# Patient Record
Sex: Female | Born: 1950 | Race: White | Hispanic: No | Smoking: Never smoker
Health system: Southern US, Community
[De-identification: ages and names within clinical notes are randomized; demographics above are authoritative.]

## PROBLEM LIST (undated history)

## (undated) DIAGNOSIS — D219 Benign neoplasm of connective and other soft tissue, unspecified: Secondary | ICD-10-CM

## (undated) DIAGNOSIS — E785 Hyperlipidemia, unspecified: Secondary | ICD-10-CM

## (undated) DIAGNOSIS — I10 Essential (primary) hypertension: Secondary | ICD-10-CM

## (undated) DIAGNOSIS — M1990 Unspecified osteoarthritis, unspecified site: Secondary | ICD-10-CM

## (undated) DIAGNOSIS — D649 Anemia, unspecified: Secondary | ICD-10-CM

## (undated) HISTORY — DX: Hyperlipidemia, unspecified: E78.5

## (undated) HISTORY — DX: Benign neoplasm of connective and other soft tissue, unspecified: D21.9

## (undated) HISTORY — DX: Essential (primary) hypertension: I10

## (undated) HISTORY — DX: Unspecified osteoarthritis, unspecified site: M19.90

## (undated) HISTORY — DX: Anemia, unspecified: D64.9

---

## 1998-08-19 ENCOUNTER — Ambulatory Visit (HOSPITAL_COMMUNITY): Admission: RE | Admit: 1998-08-19 | Discharge: 1998-08-19 | Payer: Self-pay | Admitting: Gastroenterology

## 1999-03-29 HISTORY — PX: VAGINAL HYSTERECTOMY: SUR661

## 1999-10-04 ENCOUNTER — Encounter (INDEPENDENT_AMBULATORY_CARE_PROVIDER_SITE_OTHER): Payer: Self-pay | Admitting: Specialist

## 1999-10-04 ENCOUNTER — Inpatient Hospital Stay (HOSPITAL_COMMUNITY): Admission: RE | Admit: 1999-10-04 | Discharge: 1999-10-05 | Payer: Self-pay | Admitting: Obstetrics and Gynecology

## 2001-12-28 ENCOUNTER — Ambulatory Visit (HOSPITAL_COMMUNITY): Admission: RE | Admit: 2001-12-28 | Discharge: 2001-12-28 | Payer: Self-pay | Admitting: Gastroenterology

## 2004-07-27 ENCOUNTER — Encounter: Admission: RE | Admit: 2004-07-27 | Discharge: 2004-07-27 | Payer: Self-pay | Admitting: Obstetrics and Gynecology

## 2004-08-18 ENCOUNTER — Encounter: Admission: RE | Admit: 2004-08-18 | Discharge: 2004-08-18 | Payer: Self-pay | Admitting: Obstetrics and Gynecology

## 2005-02-18 ENCOUNTER — Encounter: Admission: RE | Admit: 2005-02-18 | Discharge: 2005-02-18 | Payer: Self-pay | Admitting: Obstetrics and Gynecology

## 2005-08-09 ENCOUNTER — Encounter: Admission: RE | Admit: 2005-08-09 | Discharge: 2005-08-09 | Payer: Self-pay | Admitting: Obstetrics and Gynecology

## 2006-06-06 ENCOUNTER — Encounter: Admission: RE | Admit: 2006-06-06 | Discharge: 2006-06-06 | Payer: Self-pay | Admitting: Rheumatology

## 2006-08-11 ENCOUNTER — Encounter: Admission: RE | Admit: 2006-08-11 | Discharge: 2006-08-11 | Payer: Self-pay | Admitting: Obstetrics and Gynecology

## 2007-08-13 ENCOUNTER — Encounter: Admission: RE | Admit: 2007-08-13 | Discharge: 2007-08-13 | Payer: Self-pay | Admitting: Obstetrics and Gynecology

## 2008-08-13 ENCOUNTER — Encounter: Admission: RE | Admit: 2008-08-13 | Discharge: 2008-08-13 | Payer: Self-pay | Admitting: Obstetrics and Gynecology

## 2009-09-01 ENCOUNTER — Encounter: Admission: RE | Admit: 2009-09-01 | Discharge: 2009-09-01 | Payer: Self-pay | Admitting: Obstetrics and Gynecology

## 2010-07-23 ENCOUNTER — Other Ambulatory Visit: Payer: Self-pay | Admitting: Unknown Physician Specialty

## 2010-07-23 DIAGNOSIS — Z1231 Encounter for screening mammogram for malignant neoplasm of breast: Secondary | ICD-10-CM

## 2010-08-13 NOTE — Discharge Summary (Signed)
[facility]  Patient:    AJANEE, BUREN                       MRN: [id number] Adm. Date:  [id number] Disc. Date: [id number] Attending:  Lendon Colonel                           Discharge Summary  ADMITTING DIAGNOSIS:  Menorrhagia, persistent, with anemia.  DISCHARGE DIAGNOSIS:  Menorrhagia, persistent, with anemia.  OPERATION:  Vaginal hysterectomy.  BRIEF HISTORY:  Ms. Moe is a 60 year old female who has had numerous attempts at controlling heavy periods including hysteroscopy, numerous dilation and curettages.  She is intolerant to oral contraceptives.  Because of the continued bleeding and the anemia, patient was admitted for a vaginal hysterectomy.  A coagulation profile, comprehensive metabolic profile was normal.  Hemoglobin was 9.3, MCV was 78.  She was on oral iron.  Cardiogram was normal.  HOSPITAL COURSE:  Patient underwent an uneventful vaginal hysterectomy without complications.  Her postoperative course was uncomplicated.  The morning following surgery she was afebrile, voiding and eating, and ambulating.  She is discharged with Percocet for pain.  She is to call for fever, bleeding, or abdominal pain, or any other problems she may encounter. She will return to the office in two weeks.  CONDITION ON DISCHARGE:  Improved. DD:  10/05/99 TD:  10/05/99 Job: [id number]

## 2010-08-13 NOTE — Op Note (Signed)
   NAME:  Brooke Hurst, Brooke Hurst                          ACCOUNT NO.:  [id number]   MEDICAL RECORD NO.:  [id number]                   PATIENT TYPE:  AMB   LOCATION:  ENDO                                 FACILITY:  [facility]   PHYSICIAN:  John C. Madilyn Fireman, M.D.                 DATE OF BIRTH:  1950/11/12   DATE OF PROCEDURE:  12/28/2001  DATE OF DISCHARGE:                                 OPERATIVE REPORT   PROCEDURE:  Colonoscopy.   INDICATION FOR PROCEDURE:  Heme-positive stools and anemia and family  history of colon cancer in a first-degree relative.   DESCRIPTION OF PROCEDURE:  The patient was placed in the left lateral  decubitus position and placed on the pulse monitor with continuous low-flow  oxygen delivered by nasal cannula.  She was sedated with 70 mg IV Demerol  and 8 mg IV Versed.  The Olympus video colonoscope was inserted into the  rectum and advanced to the cecum, confirmed by transillumination at  McBurney's point and visualization of the ileocecal valve and appendiceal  orifice.  The prep was excellent.  The cecum, ascending, transverse,  descending, and sigmoid colon all appeared normal with no masses, polyps,  diverticula, or other mucosal abnormalities.  The rectum likewise appeared  normal, and retroflexed view of the anus revealed no obvious internal  hemorrhoids.  The colonoscope was then withdrawn, and the patient returned  to the recovery room in stable condition.  She tolerated the procedure well,  and there were no immediate complications.   IMPRESSION:  Normal colonoscopy.   PLAN:  Will need to repeat Hemoccults and if positive, consider upper GI  with small bowel series or possibly EGD.                                               John C. Madilyn Fireman, M.D.    JCH/MEDQ  D:  12/28/2001  T:  12/28/2001  Job:  [id number]   cc:   Raynelle Dick, M.D.  [address]  [place]  [place] [address]  Fax: [phone]

## 2010-08-13 NOTE — Op Note (Signed)
[facility]  Patient:    Brooke Hurst, Brooke Hurst                       MRN: [id number] Proc. Date: 10/04/99 Adm. Date:  [id number] Attending:  Lendon Colonel                           Operative Report  OPERATION PERFORMED:  Vaginal hysterectomy.  PREOPERATIVE DIAGNOSIS:  Menorrhagia with anemia.  POSTOPERATIVE DIAGNOSIS:  Menorrhagia with anemia.  DESCRIPTION OF PROCEDURE:  The patient was placed in lithotomy position, prepped and draped in the usual fashion, cervix injected with 10 cc of 1% xylocaine with epinephrine. The cul-de-sac was incised with sharp dissection. The uterosacral and cardinals were clamped and ligated with #0 chromic suture. The anterior cul-de-sac was incised and the uterine arteries and upper broad ligament were clamped in succession and ligated with #0 chromic suture. These specimens were retroverted. Utero-ovarian anastomosis clamped and suture ligated with #0 chromic suture. Both ovaries were normal. The uterosacral ligaments were then plicated in the midline with #0 Vicryl and the vagina was closed from the uterosacral ligaments downward with figure-of-eight sutures of #0 Vicryl. The pelvic peritoneum was then pursestringed with 2-0 PDS. Sponge and needle count was correct. The vagina was then closed with a locking suture of 2-0 PDS which is a continuation of the peritoneal suture. The patient tolerated the procedure well. Estimated blood loss was about 100 cc. She was sent to the recovery room in good condition. DD:  10/04/99 TD:  10/04/99 Job: [id number]

## 2010-08-13 NOTE — H&P (Signed)
[facility]  Patient:    Brooke Hurst, Brooke Hurst                         MRN: [id number] Adm. Date:  10/04/99 Attending:  Katherine Roan, M.D.                         History and Physical  CHIEF COMPLAINT:  Heavy prolonged periods.  HISTORY OF PRESENT ILLNESS:  Ms. Lafond is a 60 year old gravida 3, para 3 female, who complains of heavy menses.  The second day is the worst.  She has had at least three D&Cs and a hysteroscopy with endometrial resection in 1994.  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  She cannot take oral contraceptives because f headaches.  She has tried nonsteroidal anti-inflammatory drugs and that does not help her heavy flow.  She has known anemia and is on iron.  Her last menstrual period was September 08, 1999.  Pap smear was normal.  She has had a history of three normal babies and a tubal ligation in 1978.  ALLERGIES:  CODEINE.  REVIEW OF SYSTEMS:  HEENT:  She wears glasses, but no dizziness, no decrease in  visual or auditory acuity.  No headaches.  HEART:  No history of hypertension. No rheumatic fever.  No chest pain.  LUNGS:  No history of chronic cough.  No asthma. No hemoptysis.  No history of pneumonia.  GENITOURINARY:  She denies stress incontinence.  No history of recurrent urinary tract infections.  GI: Negative. No history of melena.  No weight loss.  No bowel habit change. MUSCLES/BONES/JOINTS:  No fractures.  No decrease in range of motion of any joint.  SOCIAL HISTORY:  She works at [employer].  FAMILY HISTORY:  Her mother is age 14 and has hypertension, and has cholesterol  problems.  Father died at age 41 with colon cancer.  She has a maternal grandmother with uterine cancer, and maternal grandmother with hypertension.  Her paternal grandfather has diabetes mellitus.  She has two brothers who are living and well.  PHYSICAL EXAMINATION:  VITAL SIGNS:  Weight 152 pounds, blood pressure  118/70.  HEENT:  Unremarkable.  NECK:  Supple.  Thyroid is not enlarged.  Carotid pulses equal, without bruits. No adenopathy is appreciated.  Trachea is in the midline.  LUNGS:  Clear.  BREASTS:  No masses or tenderness.  HEART:  Normal sinus rhythm, no murmurs.  ABDOMEN:  Soft, flat.  Liver, kidneys, spleen are nonpalpable.  Bowel sounds are normal.  No bruits are heard.  EXTREMITIES:  Good range of motion and equal pulses and reflexes.  PELVIC:  A clean cervix.  Uterus is anterior, slightly enlarged, with no masses. Bartholin, Skenes, and urethra are normal.  IMPRESSION:  Profuse periods, unresponsive to management, consisting of dilations and curettages and hysteroscopy.  PLAN:  A vaginal hysterectomy.  The risks and benefits are discussed with the patient. DD:  10/01/99 TD:  10/01/99 Job: [id number]

## 2010-09-03 ENCOUNTER — Ambulatory Visit
Admission: RE | Admit: 2010-09-03 | Discharge: 2010-09-03 | Disposition: A | Discharge status: Home / Self Care | Payer: BC Managed Care – PPO | Source: Ambulatory Visit | Attending: Unknown Physician Specialty | Admitting: Unknown Physician Specialty

## 2010-09-03 DIAGNOSIS — Z1231 Encounter for screening mammogram for malignant neoplasm of breast: Secondary | ICD-10-CM

## 2011-08-02 ENCOUNTER — Other Ambulatory Visit: Payer: Self-pay | Admitting: Unknown Physician Specialty

## 2011-08-02 DIAGNOSIS — Z1231 Encounter for screening mammogram for malignant neoplasm of breast: Secondary | ICD-10-CM

## 2011-08-31 ENCOUNTER — Encounter: Payer: Self-pay | Admitting: Obstetrics & Gynecology

## 2011-08-31 ENCOUNTER — Ambulatory Visit (INDEPENDENT_AMBULATORY_CARE_PROVIDER_SITE_OTHER): Payer: BC Managed Care – PPO | Admitting: Obstetrics & Gynecology

## 2011-08-31 VITALS — BP 123/81 | HR 76 | Temp 98.6°F | Resp 16 | Ht 62.0 in | Wt 155.0 lb

## 2011-08-31 DIAGNOSIS — M8580 Other specified disorders of bone density and structure, unspecified site: Secondary | ICD-10-CM | POA: Insufficient documentation

## 2011-08-31 DIAGNOSIS — Z01419 Encounter for gynecological examination (general) (routine) without abnormal findings: Secondary | ICD-10-CM

## 2011-08-31 DIAGNOSIS — M810 Age-related osteoporosis without current pathological fracture: Secondary | ICD-10-CM | POA: Insufficient documentation

## 2011-08-31 DIAGNOSIS — M899 Disorder of bone, unspecified: Secondary | ICD-10-CM

## 2011-08-31 DIAGNOSIS — M949 Disorder of cartilage, unspecified: Secondary | ICD-10-CM

## 2011-08-31 NOTE — Progress Notes (Signed)
  Subjective:    Brooke Hurst is a 61 y.o. female who presents for an annual exam. The patient has no complaints today. The patient is not currently sexually active. GYN screening history: last pap: was normal. The patient wears seatbelts: yes. The patient participates in regular exercise: walk. Has the patient ever been transfused or tattooed?: no. The patient reports that there is not domestic violence in her life.   Menstrual History: OB History    Grav Para Term Preterm Abortions TAB SAB Ect Mult Living   3 3 3       3       Menarche age: n/a No LMP recorded. Patient has had a hysterectomy.    The following portions of the patient's history were reviewed and updated as appropriate: allergies, current medications, past family history, past medical history, past social history, past surgical history and problem list.  Review of Systems A comprehensive review of systems was negative except for: Genitourinary: positive for vaginal dryness    Objective:    Vitals:  WNL General appearance: alert, cooperative and no distress Head: Normocephalic, without obvious abnormality, atraumatic Eyes: negative Throat: lips, mucosa, and tongue normal; teeth and gums normal Lungs: clear to auscultation bilaterally Breasts: normal appearance, no masses or tenderness, No nipple retraction or dimpling, No nipple discharge or bleeding Heart: regular rate and rhythm Abdomen: soft, non-tender; bowel sounds normal; no masses,  no organomegaly Pelvic: cervix normal in appearance, external genitalia normal, no adnexal masses or tenderness, no bladder tenderness, no cervical motion tenderness, perianal skin: no external genital warts noted, rectovaginal septum normal, urethra without abnormality or discharge, uterus surgically absent and vagina atrophic Extremities: no edema, redness or tenderness in the calves or thighs Skin: no lesions or rash Lymph nodes: Axillary adenopathy: none   .    Assessment:     Healthy female exam.    Plan:     All questions answered.  Pt followed by primary care for osterpenia, coloscopy, and mammograms. Pt no longer needs pap smears--no uterus and no history of abnml paps

## 2011-09-06 ENCOUNTER — Ambulatory Visit: Payer: BC Managed Care – PPO

## 2011-09-13 ENCOUNTER — Ambulatory Visit
Admission: RE | Admit: 2011-09-13 | Discharge: 2011-09-13 | Disposition: A | Discharge status: Home / Self Care | Payer: BC Managed Care – PPO | Source: Ambulatory Visit | Attending: Unknown Physician Specialty | Admitting: Unknown Physician Specialty

## 2011-09-13 DIAGNOSIS — Z1231 Encounter for screening mammogram for malignant neoplasm of breast: Secondary | ICD-10-CM

## 2011-10-14 ENCOUNTER — Other Ambulatory Visit: Payer: Self-pay | Admitting: Family Medicine

## 2011-10-14 ENCOUNTER — Ambulatory Visit: Admission: RE | Admit: 2011-10-14 | Payer: BC Managed Care – PPO | Source: Ambulatory Visit

## 2011-10-14 ENCOUNTER — Ambulatory Visit
Admission: RE | Admit: 2011-10-14 | Discharge: 2011-10-14 | Disposition: A | Discharge status: Home / Self Care | Payer: BC Managed Care – PPO | Source: Ambulatory Visit | Attending: Family Medicine | Admitting: Family Medicine

## 2011-10-14 ENCOUNTER — Ambulatory Visit: Payer: BC Managed Care – PPO

## 2011-10-14 ENCOUNTER — Ambulatory Visit (HOSPITAL_BASED_OUTPATIENT_CLINIC_OR_DEPARTMENT_OTHER): Payer: BC Managed Care – PPO

## 2011-10-14 DIAGNOSIS — I82409 Acute embolism and thrombosis of unspecified deep veins of unspecified lower extremity: Secondary | ICD-10-CM

## 2012-10-02 ENCOUNTER — Other Ambulatory Visit: Payer: Self-pay | Admitting: Unknown Physician Specialty

## 2012-10-02 DIAGNOSIS — Z1231 Encounter for screening mammogram for malignant neoplasm of breast: Secondary | ICD-10-CM

## 2012-10-04 ENCOUNTER — Ambulatory Visit (INDEPENDENT_AMBULATORY_CARE_PROVIDER_SITE_OTHER): Payer: BC Managed Care – PPO

## 2012-10-04 DIAGNOSIS — Z1231 Encounter for screening mammogram for malignant neoplasm of breast: Secondary | ICD-10-CM

## 2013-11-11 ENCOUNTER — Other Ambulatory Visit: Payer: Self-pay | Admitting: Unknown Physician Specialty

## 2013-11-11 DIAGNOSIS — Z1231 Encounter for screening mammogram for malignant neoplasm of breast: Secondary | ICD-10-CM

## 2013-11-19 ENCOUNTER — Ambulatory Visit (INDEPENDENT_AMBULATORY_CARE_PROVIDER_SITE_OTHER): Payer: BC Managed Care – PPO

## 2013-11-19 DIAGNOSIS — Z1231 Encounter for screening mammogram for malignant neoplasm of breast: Secondary | ICD-10-CM

## 2014-01-27 ENCOUNTER — Encounter: Payer: Self-pay | Admitting: Obstetrics & Gynecology

## 2014-12-12 ENCOUNTER — Other Ambulatory Visit: Payer: Self-pay | Admitting: Unknown Physician Specialty

## 2014-12-12 DIAGNOSIS — Z1231 Encounter for screening mammogram for malignant neoplasm of breast: Secondary | ICD-10-CM

## 2014-12-31 ENCOUNTER — Ambulatory Visit (INDEPENDENT_AMBULATORY_CARE_PROVIDER_SITE_OTHER): Payer: BLUE CROSS/BLUE SHIELD

## 2014-12-31 DIAGNOSIS — Z1231 Encounter for screening mammogram for malignant neoplasm of breast: Secondary | ICD-10-CM

## 2015-03-10 DIAGNOSIS — I10 Essential (primary) hypertension: Secondary | ICD-10-CM | POA: Insufficient documentation

## 2015-03-10 DIAGNOSIS — G8929 Other chronic pain: Secondary | ICD-10-CM | POA: Insufficient documentation

## 2015-05-04 DIAGNOSIS — M4806 Spinal stenosis, lumbar region: Secondary | ICD-10-CM | POA: Diagnosis not present

## 2015-05-04 DIAGNOSIS — M5416 Radiculopathy, lumbar region: Secondary | ICD-10-CM | POA: Diagnosis not present

## 2015-05-04 DIAGNOSIS — M545 Low back pain: Secondary | ICD-10-CM | POA: Diagnosis not present

## 2015-05-14 DIAGNOSIS — H25013 Cortical age-related cataract, bilateral: Secondary | ICD-10-CM | POA: Diagnosis not present

## 2015-05-18 DIAGNOSIS — M47816 Spondylosis without myelopathy or radiculopathy, lumbar region: Secondary | ICD-10-CM | POA: Diagnosis not present

## 2015-05-18 DIAGNOSIS — M545 Low back pain: Secondary | ICD-10-CM | POA: Diagnosis not present

## 2015-05-18 DIAGNOSIS — M5135 Other intervertebral disc degeneration, thoracolumbar region: Secondary | ICD-10-CM | POA: Diagnosis not present

## 2015-05-25 DIAGNOSIS — M5416 Radiculopathy, lumbar region: Secondary | ICD-10-CM | POA: Diagnosis not present

## 2015-05-25 DIAGNOSIS — M4806 Spinal stenosis, lumbar region: Secondary | ICD-10-CM | POA: Diagnosis not present

## 2015-05-25 DIAGNOSIS — M545 Low back pain: Secondary | ICD-10-CM | POA: Diagnosis not present

## 2015-07-31 DIAGNOSIS — M4807 Spinal stenosis, lumbosacral region: Secondary | ICD-10-CM | POA: Diagnosis not present

## 2015-07-31 DIAGNOSIS — E7800 Pure hypercholesterolemia, unspecified: Secondary | ICD-10-CM | POA: Diagnosis not present

## 2015-07-31 DIAGNOSIS — M8580 Other specified disorders of bone density and structure, unspecified site: Secondary | ICD-10-CM | POA: Diagnosis not present

## 2015-07-31 DIAGNOSIS — I10 Essential (primary) hypertension: Secondary | ICD-10-CM | POA: Diagnosis not present

## 2015-09-11 DIAGNOSIS — M545 Low back pain: Secondary | ICD-10-CM | POA: Diagnosis not present

## 2015-09-11 DIAGNOSIS — M4806 Spinal stenosis, lumbar region: Secondary | ICD-10-CM | POA: Diagnosis not present

## 2015-09-11 DIAGNOSIS — M4126 Other idiopathic scoliosis, lumbar region: Secondary | ICD-10-CM | POA: Diagnosis not present

## 2015-09-11 DIAGNOSIS — M4310 Spondylolisthesis, site unspecified: Secondary | ICD-10-CM | POA: Diagnosis not present

## 2016-01-07 ENCOUNTER — Other Ambulatory Visit: Payer: Self-pay | Admitting: Unknown Physician Specialty

## 2016-01-07 DIAGNOSIS — Z1231 Encounter for screening mammogram for malignant neoplasm of breast: Secondary | ICD-10-CM

## 2016-01-08 ENCOUNTER — Ambulatory Visit (INDEPENDENT_AMBULATORY_CARE_PROVIDER_SITE_OTHER): Payer: Medicare Other

## 2016-01-08 DIAGNOSIS — Z1231 Encounter for screening mammogram for malignant neoplasm of breast: Secondary | ICD-10-CM

## 2016-01-29 DIAGNOSIS — Z23 Encounter for immunization: Secondary | ICD-10-CM | POA: Diagnosis not present

## 2016-01-29 DIAGNOSIS — I10 Essential (primary) hypertension: Secondary | ICD-10-CM | POA: Diagnosis not present

## 2016-01-29 DIAGNOSIS — E7800 Pure hypercholesterolemia, unspecified: Secondary | ICD-10-CM | POA: Diagnosis not present

## 2016-01-29 DIAGNOSIS — R635 Abnormal weight gain: Secondary | ICD-10-CM | POA: Diagnosis not present

## 2016-01-29 DIAGNOSIS — M8580 Other specified disorders of bone density and structure, unspecified site: Secondary | ICD-10-CM | POA: Diagnosis not present

## 2016-01-29 DIAGNOSIS — M899 Disorder of bone, unspecified: Secondary | ICD-10-CM | POA: Diagnosis not present

## 2016-01-29 DIAGNOSIS — R319 Hematuria, unspecified: Secondary | ICD-10-CM | POA: Diagnosis not present

## 2016-01-29 DIAGNOSIS — Z0000 Encounter for general adult medical examination without abnormal findings: Secondary | ICD-10-CM | POA: Diagnosis not present

## 2016-01-29 DIAGNOSIS — E559 Vitamin D deficiency, unspecified: Secondary | ICD-10-CM | POA: Diagnosis not present

## 2016-02-15 DIAGNOSIS — M85852 Other specified disorders of bone density and structure, left thigh: Secondary | ICD-10-CM | POA: Diagnosis not present

## 2016-02-15 DIAGNOSIS — Z780 Asymptomatic menopausal state: Secondary | ICD-10-CM | POA: Diagnosis not present

## 2016-05-18 DIAGNOSIS — D225 Melanocytic nevi of trunk: Secondary | ICD-10-CM | POA: Diagnosis not present

## 2016-05-18 DIAGNOSIS — L570 Actinic keratosis: Secondary | ICD-10-CM | POA: Diagnosis not present

## 2016-05-18 DIAGNOSIS — L820 Inflamed seborrheic keratosis: Secondary | ICD-10-CM | POA: Diagnosis not present

## 2016-05-18 DIAGNOSIS — Z08 Encounter for follow-up examination after completed treatment for malignant neoplasm: Secondary | ICD-10-CM | POA: Diagnosis not present

## 2016-05-18 DIAGNOSIS — Z85828 Personal history of other malignant neoplasm of skin: Secondary | ICD-10-CM | POA: Diagnosis not present

## 2016-05-18 DIAGNOSIS — D485 Neoplasm of uncertain behavior of skin: Secondary | ICD-10-CM | POA: Diagnosis not present

## 2016-05-18 DIAGNOSIS — C44519 Basal cell carcinoma of skin of other part of trunk: Secondary | ICD-10-CM | POA: Diagnosis not present

## 2016-07-11 ENCOUNTER — Encounter: Payer: Self-pay | Admitting: Obstetrics & Gynecology

## 2016-07-11 ENCOUNTER — Ambulatory Visit (INDEPENDENT_AMBULATORY_CARE_PROVIDER_SITE_OTHER): Payer: Medicare Other | Admitting: Obstetrics & Gynecology

## 2016-07-11 VITALS — BP 125/85 | HR 102 | Ht 60.0 in | Wt 167.0 lb

## 2016-07-11 DIAGNOSIS — N952 Postmenopausal atrophic vaginitis: Secondary | ICD-10-CM

## 2016-07-11 NOTE — Progress Notes (Signed)
   Subjective:    Patient ID: Brooke Hurst, female    DOB: November 16, 1950, 66 y.o.   MRN: [id number]  HPI  66 yo female presents with vaginal burning.  She is not sexually active.  Burning occurs with washing, especially when soap hits introitus. Pt has not tried anything fo rthe pain.  This has been occurring for several years and worsening lately.    Review of Systems  Constitutional: Positive for unexpected weight change.  Respiratory: Negative.   Cardiovascular: Negative.   Gastrointestinal: Negative.   Endocrine: Negative.   Genitourinary: Positive for vaginal pain.  Musculoskeletal:       Pain in left side at level of waist.  Neurological: Negative.   Psychiatric/Behavioral: Positive for dysphoric mood.       Objective:   Physical Exam  Constitutional: She is oriented to person, place, and time. She appears well-developed and well-nourished. No distress.  HENT:  Head: Normocephalic and atraumatic.  Eyes: Conjunctivae are normal.  Pulmonary/Chest: Effort normal. Right breast exhibits no mass, no nipple discharge, no skin change and no tenderness. Left breast exhibits no mass, no nipple discharge and no tenderness. Breasts are symmetrical.  Abdominal: Soft. She exhibits no distension and no mass. There is no tenderness. There is no rebound and no guarding.  Genitourinary:     Genitourinary Comments: Vagina:  atrophic mucosa, cuff intact, non tender Uterus surgically absent Adnexa:  No masses, non tender.  Musculoskeletal: She exhibits no edema.  Neurological: She is alert and oriented to person, place, and time.  Skin: Skin is warm and dry.  Psychiatric: She has a normal mood and affect.  Vitals reviewed.  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  RTC 2 months to see improvement.  The two red  areas at 5 & 7 o'clock could be lichen planus.  If still present in 2 months, will consider biopsy. 2-F/U with PCP for pain in left side 3-Today is 6 year anniversary of husbnads death--died in [place] hunting accident.

## 2016-07-13 MED ORDER — ESTRADIOL 0.1 MG/GM VA CREA: TOPICAL_CREAM | VAGINAL | 1 refills | Status: DC

## 2016-07-13 NOTE — Addendum Note (Signed)
Addended by: Phill Myron on: 07/13/2016 10:17 AM   Modules accepted: Orders

## 2016-07-14 ENCOUNTER — Ambulatory Visit (INDEPENDENT_AMBULATORY_CARE_PROVIDER_SITE_OTHER): Payer: Medicare Other

## 2016-07-14 ENCOUNTER — Other Ambulatory Visit: Payer: Self-pay | Admitting: Unknown Physician Specialty

## 2016-07-14 DIAGNOSIS — R0602 Shortness of breath: Secondary | ICD-10-CM

## 2016-07-14 DIAGNOSIS — R1012 Left upper quadrant pain: Secondary | ICD-10-CM | POA: Diagnosis not present

## 2016-07-14 DIAGNOSIS — M549 Dorsalgia, unspecified: Secondary | ICD-10-CM

## 2016-07-14 DIAGNOSIS — R5383 Other fatigue: Secondary | ICD-10-CM | POA: Diagnosis not present

## 2016-07-14 DIAGNOSIS — M5134 Other intervertebral disc degeneration, thoracic region: Secondary | ICD-10-CM | POA: Diagnosis not present

## 2016-07-14 DIAGNOSIS — M546 Pain in thoracic spine: Secondary | ICD-10-CM | POA: Diagnosis not present

## 2016-08-09 DIAGNOSIS — I10 Essential (primary) hypertension: Secondary | ICD-10-CM | POA: Diagnosis not present

## 2016-08-09 DIAGNOSIS — R5383 Other fatigue: Secondary | ICD-10-CM | POA: Diagnosis not present

## 2016-08-09 DIAGNOSIS — R739 Hyperglycemia, unspecified: Secondary | ICD-10-CM | POA: Diagnosis not present

## 2016-08-09 DIAGNOSIS — R1012 Left upper quadrant pain: Secondary | ICD-10-CM | POA: Diagnosis not present

## 2016-08-09 DIAGNOSIS — M4807 Spinal stenosis, lumbosacral region: Secondary | ICD-10-CM | POA: Diagnosis not present

## 2016-08-09 DIAGNOSIS — E7800 Pure hypercholesterolemia, unspecified: Secondary | ICD-10-CM | POA: Diagnosis not present

## 2016-08-17 ENCOUNTER — Ambulatory Visit: Payer: Self-pay | Admitting: Obstetrics & Gynecology

## 2016-09-06 ENCOUNTER — Ambulatory Visit (INDEPENDENT_AMBULATORY_CARE_PROVIDER_SITE_OTHER): Payer: Medicare Other | Admitting: Obstetrics & Gynecology

## 2016-09-06 ENCOUNTER — Encounter: Payer: Self-pay | Admitting: Obstetrics & Gynecology

## 2016-09-06 VITALS — BP 108/76 | HR 89 | Ht 62.0 in | Wt 164.0 lb

## 2016-09-06 DIAGNOSIS — R102 Pelvic and perineal pain: Secondary | ICD-10-CM | POA: Diagnosis not present

## 2016-09-06 DIAGNOSIS — N898 Other specified noninflammatory disorders of vagina: Secondary | ICD-10-CM

## 2016-09-06 DIAGNOSIS — L298 Other pruritus: Secondary | ICD-10-CM

## 2016-09-06 DIAGNOSIS — N9089 Other specified noninflammatory disorders of vulva and perineum: Secondary | ICD-10-CM

## 2016-09-06 DIAGNOSIS — A630 Anogenital (venereal) warts: Secondary | ICD-10-CM | POA: Diagnosis not present

## 2016-09-06 NOTE — Progress Notes (Signed)
   Subjective:    Patient ID: Brooke Hurst, female    DOB: 11/15/1950, 66 y.o.   MRN: [id number]  HPI  Pt presents for f/u of vaginal burnong.  Pt has been on Estrace.  Doing better.  Not sexually active (husand deceased).  There is still some occasional burning.  Pt also feel massin left labia majora.  Non tender.  Review of Systems  Constitutional: Negative.   Respiratory: Negative.   Cardiovascular: Negative.   Gastrointestinal: Negative.   Genitourinary: Positive for vaginal pain. Negative for vaginal bleeding and vaginal discharge.       Objective:   Physical Exam  Constitutional: She is oriented to person, place, and time. She appears well-developed and well-nourished. No distress.  HENT:  Head: Normocephalic and atraumatic.  Eyes: Conjunctivae are normal.  Pulmonary/Chest: Effort normal.  Abdominal: Soft. She exhibits no distension and no mass. There is no tenderness. There is no rebound and no guarding.  Genitourinary:     Musculoskeletal: She exhibits no edema.  Neurological: She is alert and oriented to person, place, and time.  Skin: Skin is warm and dry.  Psychiatric: She has a normal mood and affect.  Vitals reviewed.   Vitals:   09/06/16 1329  BP: 108/76  Pulse: 89  Weight: 164 lb (74.4 kg)  Height: 5\' 2"  (1.575 m)       Assessment & Plan:  66 yo female with resolving atrophic vaginitis.  Vestibule still has 2 red areas.  Worrisome for lichen planus.  She was tender upon light touch   Left labia majora has several sebaceuos cysts.  Will evacuate one for diagnosis  1.  Vestibule biopsy 2.  Left vulvar Incision and drainage 3.  Continue Estrace and will call with results.  VULVAR BIOPSY NOTE  The indications for vulvar biopsy (rule out neoplasia, establish lichen sclerosus diagnosis) were reviewed.   Risks of the biopsy including pain, bleeding, infection, inadequate specimen, scarring and need for additional procedures  were discussed. The patient  stated understanding and agreed to undergo procedure today. Consent was signed,  time out performed.  The patient's vestibule was prepped with Betadine. 1% lidocaine was injected into vesticule. A 3-mm punch biopsy was done, biopsy tissue was picked up with sterile forceps and sterile scissors were used to excise the lesion.  Small bleeding was noted and hemostasis was achieved using direct pressure.  Left labia majora was also prepped with Betadine.  1% lidocaine was injected.  A scalpel was used to incise the suspected sebaceous cyst.  A cheesy material was expressed.  No pathology was sent.    The patient tolerated the procedure well. Post-procedure instructions  (pelvic rest for one week) were given to the patient. The patient is to call with heavy bleeding, fever greater than 100.4, foul smelling vaginal discharge or other concerns. The patient will be return to clinic in two weeks for discussion of results.

## 2016-09-20 ENCOUNTER — Encounter: Payer: Self-pay | Admitting: *Deleted

## 2016-09-21 ENCOUNTER — Telehealth: Payer: Self-pay | Admitting: *Deleted

## 2016-09-21 DIAGNOSIS — L309 Dermatitis, unspecified: Secondary | ICD-10-CM

## 2016-09-21 MED ORDER — CLOBETASOL PROPIONATE 0.05 % EX OINT: 1.0000 "application " | TOPICAL_OINTMENT | Freq: Two times a day (BID) | CUTANEOUS | 0 refills | Status: AC

## 2016-09-21 NOTE — Telephone Encounter (Signed)
-----   Message from Guss Bunde, MD sent at 09/21/2016 12:23 PM EDT ----- Dermatopathologist re read biopsy.  No condyloma.  Note should be in epic.

## 2016-09-21 NOTE — Telephone Encounter (Signed)
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  This RX was sent to [facility] in [place].  If not any improvement return to office.

## 2017-01-10 DIAGNOSIS — Z23 Encounter for immunization: Secondary | ICD-10-CM | POA: Diagnosis not present

## 2017-01-25 ENCOUNTER — Other Ambulatory Visit: Payer: Self-pay | Admitting: Obstetrics & Gynecology

## 2017-01-25 DIAGNOSIS — Z1239 Encounter for other screening for malignant neoplasm of breast: Secondary | ICD-10-CM

## 2017-01-27 ENCOUNTER — Ambulatory Visit (INDEPENDENT_AMBULATORY_CARE_PROVIDER_SITE_OTHER): Payer: Medicare Other

## 2017-01-27 DIAGNOSIS — Z1231 Encounter for screening mammogram for malignant neoplasm of breast: Secondary | ICD-10-CM

## 2017-01-27 DIAGNOSIS — Z1239 Encounter for other screening for malignant neoplasm of breast: Secondary | ICD-10-CM

## 2017-01-30 DIAGNOSIS — M899 Disorder of bone, unspecified: Secondary | ICD-10-CM | POA: Diagnosis not present

## 2017-01-30 DIAGNOSIS — I10 Essential (primary) hypertension: Secondary | ICD-10-CM | POA: Diagnosis not present

## 2017-01-30 DIAGNOSIS — Z0000 Encounter for general adult medical examination without abnormal findings: Secondary | ICD-10-CM | POA: Diagnosis not present

## 2017-01-30 DIAGNOSIS — R739 Hyperglycemia, unspecified: Secondary | ICD-10-CM | POA: Diagnosis not present

## 2017-01-30 DIAGNOSIS — R8279 Other abnormal findings on microbiological examination of urine: Secondary | ICD-10-CM | POA: Diagnosis not present

## 2017-01-30 DIAGNOSIS — Z23 Encounter for immunization: Secondary | ICD-10-CM | POA: Diagnosis not present

## 2017-01-30 DIAGNOSIS — R828 Abnormal findings on cytological and histological examination of urine: Secondary | ICD-10-CM | POA: Diagnosis not present

## 2017-01-30 DIAGNOSIS — R5383 Other fatigue: Secondary | ICD-10-CM | POA: Diagnosis not present

## 2017-01-30 DIAGNOSIS — M8580 Other specified disorders of bone density and structure, unspecified site: Secondary | ICD-10-CM | POA: Diagnosis not present

## 2017-01-30 DIAGNOSIS — E7800 Pure hypercholesterolemia, unspecified: Secondary | ICD-10-CM | POA: Diagnosis not present

## 2017-02-22 DIAGNOSIS — N6341 Unspecified lump in right breast, subareolar: Secondary | ICD-10-CM | POA: Diagnosis not present

## 2017-02-22 DIAGNOSIS — N63 Unspecified lump in unspecified breast: Secondary | ICD-10-CM | POA: Diagnosis not present

## 2017-02-25 HISTORY — PX: BREAST BIOPSY: SHX20

## 2017-02-28 DIAGNOSIS — N641 Fat necrosis of breast: Secondary | ICD-10-CM | POA: Diagnosis not present

## 2017-02-28 DIAGNOSIS — R590 Localized enlarged lymph nodes: Secondary | ICD-10-CM | POA: Diagnosis not present

## 2017-02-28 DIAGNOSIS — R2231 Localized swelling, mass and lump, right upper limb: Secondary | ICD-10-CM | POA: Diagnosis not present

## 2017-04-06 DIAGNOSIS — H25013 Cortical age-related cataract, bilateral: Secondary | ICD-10-CM | POA: Diagnosis not present

## 2017-08-01 DIAGNOSIS — I10 Essential (primary) hypertension: Secondary | ICD-10-CM | POA: Diagnosis not present

## 2017-08-01 DIAGNOSIS — M25561 Pain in right knee: Secondary | ICD-10-CM | POA: Diagnosis not present

## 2017-08-01 DIAGNOSIS — D473 Essential (hemorrhagic) thrombocythemia: Secondary | ICD-10-CM | POA: Diagnosis not present

## 2017-08-01 DIAGNOSIS — E7800 Pure hypercholesterolemia, unspecified: Secondary | ICD-10-CM | POA: Diagnosis not present

## 2017-08-01 DIAGNOSIS — R739 Hyperglycemia, unspecified: Secondary | ICD-10-CM | POA: Diagnosis not present

## 2017-08-18 DIAGNOSIS — M25561 Pain in right knee: Secondary | ICD-10-CM | POA: Diagnosis not present

## 2017-08-18 DIAGNOSIS — M25562 Pain in left knee: Secondary | ICD-10-CM | POA: Diagnosis not present

## 2017-10-27 DIAGNOSIS — M25561 Pain in right knee: Secondary | ICD-10-CM | POA: Diagnosis not present

## 2017-10-27 DIAGNOSIS — M1711 Unilateral primary osteoarthritis, right knee: Secondary | ICD-10-CM | POA: Diagnosis not present

## 2017-10-27 DIAGNOSIS — M25562 Pain in left knee: Secondary | ICD-10-CM | POA: Diagnosis not present

## 2017-11-07 ENCOUNTER — Other Ambulatory Visit: Payer: Self-pay

## 2017-11-07 NOTE — Patient Outreach (Signed)
[facility]) Care Management  11/07/2017  Brooke Hurst [facility] Jul 01, 1950 [id number]   Medication Adherence call to Brooke Hurst [facility] left a message for patient to call back patient is due on Simvastatin 20 mg and Candesartan/Hctz 16/12.5 mg. Brooke Hurst is showing past due under [place].   [place] Management Direct Dial [phone]  Fax [phone] [email or web address]

## 2017-11-28 DIAGNOSIS — Z23 Encounter for immunization: Secondary | ICD-10-CM | POA: Diagnosis not present

## 2017-12-20 ENCOUNTER — Other Ambulatory Visit: Payer: Self-pay

## 2017-12-20 NOTE — Patient Outreach (Signed)
[facility]) Care Management  12/20/2017  [name] [facility] Aug 30, 1950 [id number]   Medication Adherence call to Brooke Hurst spoke with patient she is due on Simvastatin 20 mg she does not have any refill and ask if we can call doctors office for more refill and to [facility]. [name] is showing past due under [place].   [place] Management Direct Dial [phone]  Fax [phone] [email or web address]

## 2018-02-05 ENCOUNTER — Ambulatory Visit (INDEPENDENT_AMBULATORY_CARE_PROVIDER_SITE_OTHER): Payer: Medicare Other | Admitting: Obstetrics & Gynecology

## 2018-02-05 ENCOUNTER — Encounter: Payer: Self-pay | Admitting: Obstetrics & Gynecology

## 2018-02-05 VITALS — BP 130/76 | HR 93 | Resp 16 | Ht 62.0 in | Wt 162.0 lb

## 2018-02-05 DIAGNOSIS — R2231 Localized swelling, mass and lump, right upper limb: Secondary | ICD-10-CM | POA: Diagnosis not present

## 2018-02-05 MED ORDER — ESTRADIOL 0.1 MG/GM VA CREA: TOPICAL_CREAM | VAGINAL | 1 refills | Status: DC

## 2018-02-05 NOTE — Progress Notes (Signed)
   Subjective:    Patient ID: Brooke Hurst, female    DOB: 01-27-1951, 67 y.o.   MRN: [id number]  HPI  67 yo female presents for evaluation of right breast pain and needing f.u imaging.  Pt also here for f/u vulvar burning.  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  Patient used Estrace cream and felt much better.  She has not used any for the past 2 months and is feeling the sensitivity again.  Patient has had a vaginal hysterectomy in the past.  Patient had a right axillary ultrasound and biopsy done at [facility] center.  It showed inflammation.  She has had problems getting her follow-up schedule at [facility].  She agrees to go to the breast center in [place] for all follow-up imaging at this point.  Patient has PCP that handles all other medical concerns.  Review of Systems  Constitutional: Negative.   Gastrointestinal: Negative.   Genitourinary: Positive for vaginal pain. Negative for vaginal bleeding and vaginal discharge.  Skin:       Has had basal and squamous in the past.  No derm appt for past 2 years.   Psychiatric/Behavioral: Negative.        Objective:   Physical Exam  Constitutional: She is oriented to person, place, and time. She appears well-developed and well-nourished. No distress.  HENT:  Head: Normocephalic and atraumatic.  Eyes: Conjunctivae are normal.  Cardiovascular: Normal rate.  Pulmonary/Chest: Effort normal.    Abdominal: Soft. Bowel sounds are normal. She exhibits no distension and no mass. There is no tenderness. There is no rebound and no guarding.  Genitourinary:  Genitourinary Comments: atrohpic vaginia No lesion on vulva Vaginal cuff intact  Musculoskeletal: She exhibits no edema.  Neurological: She is alert and oriented to person, place, and time.  Skin: Skin is warm and dry.  Psychiatric: She has a normal mood and affect.  Vitals reviewed.     Assessment & Plan:   67 yo female right breast pain and history of  biopsy in the past year.  Patient also has atrophic vaginitis.  1.  Right breast pain (new): Patient will get follow-up ultrasound and mammogram done at the breast center.  We will facilitate this test as well as getting other films if needed.  Understands she may be asked to get the films from [place].  2.  Vaginitis: Patient to resume the Estrace.  She said it was not that expensive.  If patient still symptomatic once she is resume the Estrace, she should make another appointment so we can evaluate once the one is fully estrogenized.

## 2018-02-06 DIAGNOSIS — R5383 Other fatigue: Secondary | ICD-10-CM | POA: Diagnosis not present

## 2018-02-06 DIAGNOSIS — M8580 Other specified disorders of bone density and structure, unspecified site: Secondary | ICD-10-CM | POA: Diagnosis not present

## 2018-02-06 DIAGNOSIS — Z0000 Encounter for general adult medical examination without abnormal findings: Secondary | ICD-10-CM | POA: Diagnosis not present

## 2018-02-06 DIAGNOSIS — I10 Essential (primary) hypertension: Secondary | ICD-10-CM | POA: Diagnosis not present

## 2018-02-06 DIAGNOSIS — R739 Hyperglycemia, unspecified: Secondary | ICD-10-CM | POA: Diagnosis not present

## 2018-02-06 DIAGNOSIS — E7800 Pure hypercholesterolemia, unspecified: Secondary | ICD-10-CM | POA: Diagnosis not present

## 2018-02-09 ENCOUNTER — Ambulatory Visit
Admission: RE | Admit: 2018-02-09 | Discharge: 2018-02-09 | Disposition: A | Discharge status: Home / Self Care | Payer: Medicare Other | Source: Ambulatory Visit | Attending: Obstetrics & Gynecology | Admitting: Obstetrics & Gynecology

## 2018-02-09 DIAGNOSIS — R2231 Localized swelling, mass and lump, right upper limb: Secondary | ICD-10-CM

## 2018-02-09 DIAGNOSIS — R928 Other abnormal and inconclusive findings on diagnostic imaging of breast: Secondary | ICD-10-CM | POA: Diagnosis not present

## 2018-02-09 DIAGNOSIS — N644 Mastodynia: Secondary | ICD-10-CM | POA: Diagnosis not present

## 2018-02-21 ENCOUNTER — Other Ambulatory Visit: Payer: Self-pay

## 2018-02-21 NOTE — Patient Outreach (Signed)
[facility]) Care Management  02/21/2018  Brooke Hurst [facility] October 26, 1950 [id number]   Medication Adherence call to Brooke Hurst spoke with patient she still has medication and does not need any at this patient is due on Candesarta/ HCTZ 16/12.5 mg patient will order it when due. Brooke Hurst is showing past due under [place].   [facility] Management Direct Dial [phone]  Fax [phone] [email or web address]

## 2018-03-05 DIAGNOSIS — D125 Benign neoplasm of sigmoid colon: Secondary | ICD-10-CM | POA: Diagnosis not present

## 2018-03-05 DIAGNOSIS — Z86010 Personal history of colonic polyps: Secondary | ICD-10-CM | POA: Diagnosis not present

## 2018-03-05 DIAGNOSIS — K5730 Diverticulosis of large intestine without perforation or abscess without bleeding: Secondary | ICD-10-CM | POA: Diagnosis not present

## 2018-03-05 DIAGNOSIS — D122 Benign neoplasm of ascending colon: Secondary | ICD-10-CM | POA: Diagnosis not present

## 2018-03-07 DIAGNOSIS — Z13820 Encounter for screening for osteoporosis: Secondary | ICD-10-CM | POA: Diagnosis not present

## 2018-03-07 DIAGNOSIS — Z780 Asymptomatic menopausal state: Secondary | ICD-10-CM | POA: Diagnosis not present

## 2018-03-07 DIAGNOSIS — M85851 Other specified disorders of bone density and structure, right thigh: Secondary | ICD-10-CM | POA: Diagnosis not present

## 2018-03-16 DIAGNOSIS — J9801 Acute bronchospasm: Secondary | ICD-10-CM | POA: Diagnosis not present

## 2018-03-16 DIAGNOSIS — J069 Acute upper respiratory infection, unspecified: Secondary | ICD-10-CM | POA: Diagnosis not present

## 2018-03-16 DIAGNOSIS — H109 Unspecified conjunctivitis: Secondary | ICD-10-CM | POA: Diagnosis not present

## 2018-03-23 ENCOUNTER — Other Ambulatory Visit: Payer: Self-pay

## 2018-03-23 NOTE — Patient Outreach (Signed)
[facility]) Care Management  03/23/2018  Brooke Hurst [facility] 1950-11-21 [id number]   Medication Adherence call to Brooke Hurst spoke with patient she is due on Simvastatin 20 mg patient ask if we can call [facility] an order this medication patient will pick up once it's ready from [place]. [name] is showing past due under [place].   [place] Management Direct Dial [phone]  Fax [phone] [email or web address]

## 2018-04-16 NOTE — Progress Notes (Signed)
Office Visit Note  Patient: Brooke Hurst             Date of Birth: February 19, 1951           MRN: [id number]             PCP: Finis Bud, MD Referring: Finis Bud, MD Visit Date: 04/30/2018 Occupation: retired, Pensions consultant for [employer].  Subjective:  Pain in multiple joints   History of Present Illness: Brooke Hurst is a 68 y.o. female seen in consultation per request of her PCP.  According to patient her symptoms of started with arthritis about 15 years ago.  She states she has been on meloxicam for pain management for about 10 years.  She believes that meloxicam is not as effective anymore.  She also has longstanding history of disc disease of lumbar spine with a spinal stenosis.  She has been seen by an orthopedic surgeon in [place].  She does not want to have surgery at this point.  She also has discomfort in her bilateral shoulders, bilateral elbows, bilateral hands, bilateral knee joints and sometimes in her ankles.  She has noticed swelling in her hands and knee joints.  She is occasional discomfort in her cervical spine.  Activities of Daily Living:  Patient reports morning stiffness for 1 hour.   Patient Reports nocturnal pain.  Difficulty dressing/grooming: Denies Difficulty climbing stairs: Reports Difficulty getting out of chair: Reports Difficulty using hands for taps, buttons, cutlery, and/or writing: Denies  Review of Systems  Constitutional: Negative for fatigue, night sweats, weight gain and weight loss.  HENT: Negative for mouth sores, trouble swallowing, trouble swallowing, mouth dryness and nose dryness.   Eyes: Negative for pain, redness, visual disturbance and dryness.  Respiratory: Negative for cough, shortness of breath and difficulty breathing.   Cardiovascular: Negative for chest pain, palpitations, hypertension, irregular heartbeat and swelling in legs/feet.  Gastrointestinal: Negative for blood in stool,  constipation and diarrhea.  Endocrine: Negative for increased urination.  Genitourinary: Negative for vaginal dryness.  Musculoskeletal: Positive for arthralgias, joint pain, joint swelling and morning stiffness. Negative for myalgias, muscle weakness, muscle tenderness and myalgias.  Skin: Negative for color change, rash, hair loss, skin tightness, ulcers and sensitivity to sunlight.  Allergic/Immunologic: Negative for susceptible to infections.  Neurological: Negative for dizziness, memory loss, night sweats and weakness.  Hematological: Negative for swollen glands.  Psychiatric/Behavioral: Negative for depressed mood and sleep disturbance. The patient is not nervous/anxious.     PMFS History:  Patient Active Problem List   Diagnosis Date Noted  . Vulvar pain 09/06/2016  . Osteopenia 08/31/2011    Past Medical History:  Diagnosis Date  . Anemia   . Arthritis   . Fibroids   . Hyperlipidemia   . Hypertension     Family History  Problem Relation Age of Onset  . Diabetes Mother   . Hypertension Mother   . Diabetes Paternal Grandmother   . Diabetes Paternal Grandfather   . Heart attack Maternal Grandmother   . Cancer Maternal Grandmother        uterine  . Cancer Father        colon,liver  . Diabetes Brother   . Healthy Son   . Healthy Daughter   . Healthy Daughter    Past Surgical History:  Procedure Laterality Date  . BREAST BIOPSY Right 02/2017  . VAGINAL HYSTERECTOMY  2001   still has ovaries   Social History   Social History Narrative  .  Not on file    There is no immunization history on file for this patient.   Objective: Vital Signs: BP (!) 143/97 (BP Location: Right Arm, Patient Position: Sitting, Cuff Size: Normal)   Pulse 86   Resp 14   Ht 4' 11.75" (1.518 m)   Wt 164 lb (74.4 kg)   BMI 32.30 kg/m    Physical Exam Vitals signs and nursing note reviewed.  Constitutional:      Appearance: She is well-developed.  HENT:     Head: Normocephalic  and atraumatic.  Eyes:     Conjunctiva/sclera: Conjunctivae normal.  Neck:     Musculoskeletal: Normal range of motion.  Cardiovascular:     Rate and Rhythm: Normal rate and regular rhythm.     Heart sounds: Normal heart sounds.  Pulmonary:     Effort: Pulmonary effort is normal.     Breath sounds: Normal breath sounds.  Abdominal:     General: Bowel sounds are normal.     Palpations: Abdomen is soft.  Lymphadenopathy:     Cervical: No cervical adenopathy.  Skin:    General: Skin is warm and dry.     Capillary Refill: Capillary refill takes less than 2 seconds.  Neurological:     Mental Status: She is alert and oriented to person, place, and time.  Psychiatric:        Behavior: Behavior normal.      Musculoskeletal Exam: C-spine thoracic spine good range of motion.  She has limited painful range of motion of lumbar spine.  She has some tenderness over the SI joint region.  Shoulder joints were in good range of motion.  She has mild contracture in her right elbow.  Wrist joints were in good range of motion.  She has DIP and PIP thickening in bilateral hands without any synovitis.  Hip joints were in good range of motion.  Her knee joints have limited extension bilaterally without any joint swelling or effusion.  She has some changes in her feet with dorsal spurring and right first MTP valgus deformity.  CDAI Exam: CDAI Score: Not documented Patient Global Assessment: Not documented; Provider Global Assessment: Not documented Swollen: Not documented; Tender: Not documented Joint Exam   Not documented   There is currently no information documented on the homunculus. Go to the Rheumatology activity and complete the homunculus joint exam.  Investigation: Findings:  February 06, 2018 CBC normal, CMP normal, UA negative except for 2+ blood, LDL 120, hemoglobin A1c 5.5, vitamin D 32.9   Imaging: Xr Hand 2 View Left  Result Date: 04/30/2018 PIP and DIP narrowing was noted.  Severe  CMC narrowing was noted.  No intercarpal, radiocarpal or metacarpal carpal changes were noted.  No erosive changes were noted. Impression: These findings are consistent with osteoarthritis of the hand.  Xr Hand 2 View Right  Result Date: 04/30/2018 PIP and DIP narrowing was noted.  Severe CMC narrowing with subluxation of the joint was noted.  No MCP, intercarpal radiocarpal joint space narrowing was noted. Impression: These findings are consistent with severe osteoarthritis of the hand.  Xr Knee 3 View Left  Result Date: 04/30/2018 Severe medial compartment narrowing with lateral osteophytes was noted.  Severe patellofemoral narrowing was noted.  No chondrocalcinosis was noted. Impression: These findings are consistent with severe osteoarthritis and severe chondromalacia patella.  Xr Knee 3 View Right  Result Date: 04/30/2018 Moderate to severe medial compartment narrowing was noted.  Intercondylar osteophytes and lateral osteophytes were noted.  Severe  patellofemoral narrowing was noted.  No chondrocalcinosis was noted. 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  Xr Pelvis 1-2 Views  Result Date: 04/30/2018 No SI joint sclerosis or narrowing was noted.   Recent Labs: No results found for: WBC, HGB, PLT, NA, K, CL, CO2, GLUCOSE, BUN, CREATININE, BILITOT, ALKPHOS, AST, ALT, PROT, ALBUMIN, CALCIUM, GFRAA, QFTBGOLD, QFTBGOLDPLUS  Speciality Comments: No specialty comments available.  Procedures:  No procedures performed Allergies: Codeine   Assessment / Plan:     Visit Diagnoses: Pain in both hands -patient complains of pain in bilateral hands.  She has DIP and PIP thickening.  No synovitis was noted.  She is HLA-B27 positive.  Joint protection muscle strengthening was discussed.  She is currently taking Mobic which helps her to some extent.  A list of natural anti-inflammatories was given.  Plan: XR Hand 2 View Right, XR  Hand 2 View Left.  The x-rays were consistent with bilateral hand osteoarthritis and severe CMC joint arthritis and subluxation of her right CMC joint.  Chronic pain of both knees -she has limited range of motion of bilateral knee joints with limited extension.  No warmth swelling or effusion was noted.  She has been getting Visco supplement injections.  Plan: XR KNEE 3 VIEW RIGHT, XR KNEE 3 VIEW LEFT moderate to severe osteoarthritis in the right knee and severe osteoarthritis in left knee joint was noted.  Bilateral severe chondromalacia patella was noted.  Patient states that she has had cortisone injection to bilateral knee joints last year which were not effective.  She is interested in getting Visco supplement injections to bilateral knee joints.  We will apply for those.  HLA B27 positive -she has no synovitis or inflammatory arthritis on examination today.  Significance of HLA-B27 was discussed.  Previous pt, could not find records in [facility]  DDD (degenerative disc disease), lumbar - With spinal stenosis. F/U in [place].  She has chronic lower back pain.  Chronic midline low back pain without sciatica - Plan: XR Pelvis 1-2 Views.  X-ray of the SI joints were unremarkable.  History of osteopenia - on Boniva.  Patient reports history of height loss over the years.  History of diverticulosis  Benign hematuria  Hypercholesterolemia  Essential hypertension-her blood pressure is elevated today.  Have advised her to monitor blood pressure closely.  Class 1 obesity-weight loss diet and exercise was discussed.  Orders: Orders Placed This Encounter  Procedures  . XR Hand 2 View Right  . XR Hand 2 View Left  . XR KNEE 3 VIEW RIGHT  . XR KNEE 3 VIEW LEFT  . XR Pelvis 1-2 Views   Meds ordered this encounter  Medications  . diclofenac sodium (VOLTAREN) 1 % GEL    Sig: Apply 2 to 4 grams topically to affected joint up to four times a day.    Dispense:  4 Tube    Refill:  2    Face-to-face  time spent with patient was 45 minutes. Greater than 50% of time was spent in counseling and coordination of care.  Follow-Up Instructions: Return in about 3 months (around 07/29/2018) for Osteoarthritis.   Bo Merino, MD  Note - This record has been created using Editor, commissioning.  Chart creation errors have been sought, but may not always  have been located. Such creation errors do not reflect on  the standard of medical care.

## 2018-04-30 ENCOUNTER — Ambulatory Visit (INDEPENDENT_AMBULATORY_CARE_PROVIDER_SITE_OTHER): Payer: Self-pay

## 2018-04-30 ENCOUNTER — Encounter (INDEPENDENT_AMBULATORY_CARE_PROVIDER_SITE_OTHER): Payer: Self-pay

## 2018-04-30 ENCOUNTER — Telehealth: Payer: Self-pay

## 2018-04-30 ENCOUNTER — Ambulatory Visit (INDEPENDENT_AMBULATORY_CARE_PROVIDER_SITE_OTHER): Payer: Medicare Other

## 2018-04-30 ENCOUNTER — Ambulatory Visit: Payer: Medicare Other | Admitting: Rheumatology

## 2018-04-30 ENCOUNTER — Encounter: Payer: Self-pay | Admitting: Rheumatology

## 2018-04-30 VITALS — BP 143/97 | HR 86 | Resp 14 | Ht 59.75 in | Wt 164.0 lb

## 2018-04-30 DIAGNOSIS — E6609 Other obesity due to excess calories: Secondary | ICD-10-CM

## 2018-04-30 DIAGNOSIS — M25562 Pain in left knee: Secondary | ICD-10-CM | POA: Diagnosis not present

## 2018-04-30 DIAGNOSIS — Z8739 Personal history of other diseases of the musculoskeletal system and connective tissue: Secondary | ICD-10-CM

## 2018-04-30 DIAGNOSIS — E7800 Pure hypercholesterolemia, unspecified: Secondary | ICD-10-CM

## 2018-04-30 DIAGNOSIS — M79642 Pain in left hand: Secondary | ICD-10-CM

## 2018-04-30 DIAGNOSIS — G8929 Other chronic pain: Secondary | ICD-10-CM

## 2018-04-30 DIAGNOSIS — M79641 Pain in right hand: Secondary | ICD-10-CM

## 2018-04-30 DIAGNOSIS — Z1589 Genetic susceptibility to other disease: Secondary | ICD-10-CM

## 2018-04-30 DIAGNOSIS — M5136 Other intervertebral disc degeneration, lumbar region: Secondary | ICD-10-CM

## 2018-04-30 DIAGNOSIS — Z8719 Personal history of other diseases of the digestive system: Secondary | ICD-10-CM

## 2018-04-30 DIAGNOSIS — Z6832 Body mass index (BMI) 32.0-32.9, adult: Secondary | ICD-10-CM

## 2018-04-30 DIAGNOSIS — I10 Essential (primary) hypertension: Secondary | ICD-10-CM

## 2018-04-30 DIAGNOSIS — M25561 Pain in right knee: Secondary | ICD-10-CM

## 2018-04-30 DIAGNOSIS — M545 Low back pain: Secondary | ICD-10-CM

## 2018-04-30 DIAGNOSIS — N029 Recurrent and persistent hematuria with unspecified morphologic changes: Secondary | ICD-10-CM

## 2018-04-30 MED ORDER — DICLOFENAC SODIUM 1 % TD GEL: TRANSDERMAL | 2 refills | Status: AC

## 2018-04-30 NOTE — Telephone Encounter (Signed)
Please apply for bilateral knee visco, per Dr. Deveshwar. Thanks!  

## 2018-04-30 NOTE — Patient Instructions (Signed)

## 2018-04-30 NOTE — Progress Notes (Signed)
Pharmacy Note  Patient presents to [facility] for initial consultation with Dr. Estanislado Pandy.  She was seen by the pharmacist for counseling on natural anti-inflammatories and Voltaren gel for osteoarthritis.  She is currently taking Mobic 15 mg daily.  Has patient tried NSAID's previously?  Yes  Patient on the purpose, proper use, and adverse effects of Voltaren gel including headache, increased blood pressure, and risk of GI bleed.  Instructed patient to avoid applying to open skin wound, or on areas of infection, rash, burn, or peeling skin.  Advised  patient wait at least 10 minutes before dressing or wearing gloves and wait at least 1 hour before you bathe or shower.  Counseled patient to wash hands after application and avoid contact with face/eyes.  Advised patient to apply with q-tip if applying to hands to minimize absorption on palms.  Patient given GoodRx coupon to help with cost as it is not routinely covered by insurance.  Instructed patient that she can not use Voltaren gel in combination with Mobic.  Patient verbalized understanding.   Counseled on the purpose, proper use, and adverse effects of natural anti-inflammatories including upset stomach and increased bleeding risk.  Encouraged patient to add one medication at a time and to include on medication list to monitor for adverse effects and drug interactions.  Given educational handout with recommended doses.  All questions encouraged and answered.  Instructed patient to call with any further questions or concerns.  Mariella Saa, PharmD, [facility] Rheumatology Clinical Pharmacist  04/30/2018 9:37 AM

## 2018-05-03 NOTE — Telephone Encounter (Signed)
Noted  

## 2018-05-15 ENCOUNTER — Telehealth (INDEPENDENT_AMBULATORY_CARE_PROVIDER_SITE_OTHER): Payer: Self-pay

## 2018-05-15 NOTE — Telephone Encounter (Signed)
Submitted VOB for Synvisc series, bilateral knee. 

## 2018-05-28 ENCOUNTER — Telehealth (INDEPENDENT_AMBULATORY_CARE_PROVIDER_SITE_OTHER): Payer: Self-pay

## 2018-05-28 ENCOUNTER — Ambulatory Visit: Payer: Self-pay | Admitting: Rheumatology

## 2018-05-28 NOTE — Telephone Encounter (Signed)
Please schedule patient an appointment with Dr. Estanislado Pandy or Lovena Le for gel injection.  Thank You.  Approved for Synvisc series, bilateral knee. [place] Patient will be responsible for 20% OOP. No Co-pay No PA required

## 2018-06-11 ENCOUNTER — Other Ambulatory Visit: Payer: Self-pay

## 2018-06-11 ENCOUNTER — Ambulatory Visit (INDEPENDENT_AMBULATORY_CARE_PROVIDER_SITE_OTHER): Payer: Medicare Other | Admitting: Physician Assistant

## 2018-06-11 DIAGNOSIS — M170 Bilateral primary osteoarthritis of knee: Secondary | ICD-10-CM | POA: Diagnosis not present

## 2018-06-11 MED ORDER — LIDOCAINE HCL 1 % IJ SOLN
1.5000 mL | INTRAMUSCULAR | Status: AC | PRN
  Administered 2018-06-11: 1.5 mL

## 2018-06-11 MED ORDER — HYLAN G-F 20 16 MG/2ML IX SOSY
16.0000 mg | PREFILLED_SYRINGE | INTRA_ARTICULAR | Status: AC | PRN
  Administered 2018-06-11: 16 mg via INTRA_ARTICULAR

## 2018-06-11 NOTE — Progress Notes (Signed)
   Procedure Note  Patient: Brooke Hurst             Date of Birth: 04-09-1950           MRN: [id number]             Visit Date: 06/11/2018  Procedures: Visit Diagnoses: Primary osteoarthritis of both knees - Plan: Large Joint Inj: bilateral knee Synvisc #1 bilateral B/B Large Joint Inj: bilateral knee on 06/11/2018 12:22 PM Indications: pain Details: 25 G 1.5 in needle, medial approach  Arthrogram: No  Medications (Right): 16 mg Hylan 16 MG/2ML; 1.5 mL lidocaine 1 % Aspirate (Right): 0 mL Medications (Left): 16 mg Hylan 16 MG/2ML; 1.5 mL lidocaine 1 % Aspirate (Left): 0 mL Outcome: tolerated well, no immediate complications Procedure, treatment alternatives, risks and benefits explained, specific risks discussed. Consent was given by the patient. Immediately prior to procedure a time out was called to verify the correct patient, procedure, equipment, support staff and site/side marked as required. Patient was prepped and draped in the usual sterile fashion.     Patient tolerated the procedure well.  Hazel Sams, PA-C

## 2018-06-18 ENCOUNTER — Other Ambulatory Visit: Payer: Self-pay

## 2018-06-18 ENCOUNTER — Ambulatory Visit (INDEPENDENT_AMBULATORY_CARE_PROVIDER_SITE_OTHER): Payer: Medicare Other | Admitting: Physician Assistant

## 2018-06-18 DIAGNOSIS — M170 Bilateral primary osteoarthritis of knee: Secondary | ICD-10-CM

## 2018-06-18 MED ORDER — LIDOCAINE HCL 1 % IJ SOLN
1.5000 mL | INTRAMUSCULAR | Status: AC | PRN
  Administered 2018-06-18: 1.5 mL

## 2018-06-18 MED ORDER — HYLAN G-F 20 16 MG/2ML IX SOSY
16.0000 mg | PREFILLED_SYRINGE | INTRA_ARTICULAR | Status: AC | PRN
  Administered 2018-06-18: 16 mg via INTRA_ARTICULAR

## 2018-06-18 NOTE — Progress Notes (Signed)
   Procedure Note  Patient: ELLAMAY FORS             Date of Birth: [birth year]           MRN: [id number]             Visit Date: 06/18/2018  Procedures: Visit Diagnoses: Primary osteoarthritis of both knees - Plan: Large Joint Inj: bilateral knee Synvisc #2 bilateral knee joint injections B/B Large Joint Inj: bilateral knee on 06/18/2018 8:36 AM Indications: pain Details: 25 G 1.5 in needle, medial approach  Arthrogram: No  Medications (Right): 16 mg Hylan 16 MG/2ML; 1.5 mL lidocaine 1 % Aspirate (Right): 0 mL Medications (Left): 16 mg Hylan 16 MG/2ML; 1.5 mL lidocaine 1 % Aspirate (Left): 0 mL Outcome: tolerated well, no immediate complications Procedure, treatment alternatives, risks and benefits explained, specific risks discussed. Consent was given by the patient. Immediately prior to procedure a time out was called to verify the correct patient, procedure, equipment, support staff and site/side marked as required. Patient was prepped and draped in the usual sterile fashion.    Patient tolerated the procedure well.  Hazel Sams, PA-C

## 2018-06-25 ENCOUNTER — Ambulatory Visit: Payer: Medicare Other | Admitting: Physician Assistant

## 2018-06-25 ENCOUNTER — Ambulatory Visit (INDEPENDENT_AMBULATORY_CARE_PROVIDER_SITE_OTHER): Payer: Medicare Other | Admitting: Physician Assistant

## 2018-06-25 ENCOUNTER — Other Ambulatory Visit: Payer: Self-pay

## 2018-06-25 DIAGNOSIS — M170 Bilateral primary osteoarthritis of knee: Secondary | ICD-10-CM

## 2018-06-25 MED ORDER — LIDOCAINE HCL 1 % IJ SOLN
1.5000 mL | INTRAMUSCULAR | Status: AC | PRN
  Administered 2018-06-25: 1.5 mL

## 2018-06-25 MED ORDER — HYLAN G-F 20 16 MG/2ML IX SOSY
16.0000 mg | PREFILLED_SYRINGE | INTRA_ARTICULAR | Status: AC | PRN
  Administered 2018-06-25: 16 mg via INTRA_ARTICULAR

## 2018-06-25 NOTE — Progress Notes (Signed)
   Procedure Note  Patient: Brooke Hurst             Date of Birth: 1950/09/30           MRN: [id number]             Visit Date: 06/25/2018  Procedures: Visit Diagnoses: Primary osteoarthritis of both knees Synvisc #3 bilateral knee joint injections B/B Large Joint Inj: bilateral knee on 06/25/2018 8:07 AM Indications: pain Details: 25 G 1.5 in needle, medial approach  Arthrogram: No  Medications (Right): 16 mg Hylan 16 MG/2ML; 1.5 mL lidocaine 1 % Aspirate (Right): 0 mL Medications (Left): 16 mg Hylan 16 MG/2ML; 1.5 mL lidocaine 1 % Aspirate (Left): 0 mL Outcome: tolerated well, no immediate complications Procedure, treatment alternatives, risks and benefits explained, specific risks discussed. Consent was given by the patient. Immediately prior to procedure a time out was called to verify the correct patient, procedure, equipment, support staff and site/side marked as required. Patient was prepped and draped in the usual sterile fashion.     Patient tolerated the procedure well.  Hazel Sams, PA-C

## 2018-07-25 NOTE — Progress Notes (Signed)
Virtual Visit via Telephone Note  I connected with Brooke Hurst on 07/25/18 at  9:45 AM EDT by telephone and verified that I am speaking with the correct person using two identifiers.   I discussed the limitations, risks, security and privacy concerns of performing an evaluation and management service by telephone and the availability of in person appointments. I also discussed with the patient that there may be a patient responsible charge related to this service. The patient expressed understanding and agreed to proceed. This service was conducted via virtual visit. She was unable to use webex, so we reached her by telephone. The patient was located at home. I was located in my office.  Consent was obtained prior to the virtual visit and is aware of possible charges through their insurance for this visit.  The patient is an established patient.  Dr. Estanislado Pandy, MD conducted the virtual visit and Brooke Sams, PA-C acted as scribe during the service.  Office staff helped with scheduling follow up visits after the service was conducted.    CC: Pain in both hands   History of Present Illness: Patient is a 68 year old female with a past medical history of osteoarthritis and DDD.  She has noticed improvement in bilateral knee joints since having visco gel injections in March 2020.  She has been able to walk more for exercise.  She has pain in both hands.  She has CMC joint pain and inflammation.  She uses voltaren gel topically PRN for pain relief. She has mild intermittent lower back pain.  She denies any sciatica.     She is on Boniva once monthly.  Her PCP orders DEXA.    Review of Systems  Constitutional: Negative for fever and malaise/fatigue.  Eyes: Negative for photophobia, pain, discharge and redness.  Respiratory: Negative for cough, shortness of breath and wheezing.   Cardiovascular: Negative for chest pain and palpitations.  Gastrointestinal: Negative for blood in stool, constipation and  diarrhea.  Genitourinary: Negative for dysuria.  Musculoskeletal: Positive for joint pain. Negative for back pain, myalgias and neck pain.       +Joint swelling  Skin: Negative for rash.  Neurological: Negative for dizziness and headaches.  Psychiatric/Behavioral: Negative for depression. The patient is not nervous/anxious and does not have insomnia.      Observations/Objective: Physical Exam  Constitutional: She is oriented to person, place, and time.  Neurological: She is alert and oriented to person, place, and time.  Psychiatric: Mood, memory, affect and judgment normal.   Patient reports morning stiffness for 30  minutes.   Patient reports nocturnal pain.  Difficulty dressing/grooming: Denies Difficulty climbing stairs: Reports Difficulty getting out of chair: Denies Difficulty using hands for taps, buttons, cutlery, and/or writing: Denies   Assessment and Plan: Primary osteoarthritis of both hands: She has chronic pain in both hands, especially bilateral CMC joints. She has voltaren gel which she applies topically PRN for pain relief. She has no joint swelling.  We discussed purchasing a CMC joint braces to wear during the day and arthritis gloves to wear at night.  Joint protection and muscle strengthening were discussed.  Hand exercises were discussed.  She was advised to notify us if she develops increased joint pain or joint swelling.  She will follow up in 6 months.   Primary osteoarthritis of both knee joints: She had visco gel injections bilaterally in March 2020.  She has noticed significant improvement since having these injections.  She has no joint swelling.  She has been walking more for exercise.  We discussed the importance of lower extremity muscle strengthening.    HLA B27 positive -She has no synovitis or inflammatory arthritis on examination today.  Significance of HLA-B27 was discussed in the past.  Previous pt, could not find records in [facility].    DDD  (degenerative disc disease), lumbar - With spinal stenosis. F/U in [place].  She has mild intermittent lower back pain.  She experiences nocturnal lower back pain occasionally. She was encouraged to continue to perform lower back exercises and core strengthening.   Chronic midline low back pain without sciatica - X-ray of the SI joints were unremarkable.  She has intermittent lower back pain but no symptoms of sciatica.   History of osteopenia - on Boniva once monthly.  Patient reports history of height loss over the years.  PCP orders DEXAs.   Follow Up Instructions: She will follow up in 6 months.   I discussed the assessment and treatment plan with the patient. The patient was provided an opportunity to ask questions and all were answered. The patient agreed with the plan and demonstrated an understanding of the instructions.   The patient was advised to call back or seek an in-person evaluation if the symptoms worsen or if the condition fails to improve as anticipated.  I provided 25 minutes of non-face-to-face time during this encounter. Brooke Merino, MD   Scribed by-  Ofilia Neas, PA-C

## 2018-08-01 ENCOUNTER — Ambulatory Visit: Payer: Medicare Other | Admitting: Rheumatology

## 2018-08-01 ENCOUNTER — Encounter: Payer: Self-pay | Admitting: Rheumatology

## 2018-08-01 ENCOUNTER — Telehealth (INDEPENDENT_AMBULATORY_CARE_PROVIDER_SITE_OTHER): Payer: Medicare Other | Admitting: Rheumatology

## 2018-08-01 DIAGNOSIS — M19041 Primary osteoarthritis, right hand: Secondary | ICD-10-CM | POA: Diagnosis not present

## 2018-08-01 DIAGNOSIS — Z1589 Genetic susceptibility to other disease: Secondary | ICD-10-CM

## 2018-08-01 DIAGNOSIS — M51369 Other intervertebral disc degeneration, lumbar region without mention of lumbar back pain or lower extremity pain: Secondary | ICD-10-CM

## 2018-08-01 DIAGNOSIS — M5136 Other intervertebral disc degeneration, lumbar region: Secondary | ICD-10-CM

## 2018-08-01 DIAGNOSIS — M19042 Primary osteoarthritis, left hand: Secondary | ICD-10-CM | POA: Diagnosis not present

## 2018-08-01 DIAGNOSIS — E7800 Pure hypercholesterolemia, unspecified: Secondary | ICD-10-CM

## 2018-08-01 DIAGNOSIS — Z8739 Personal history of other diseases of the musculoskeletal system and connective tissue: Secondary | ICD-10-CM

## 2018-08-01 DIAGNOSIS — M545 Low back pain: Secondary | ICD-10-CM

## 2018-08-01 DIAGNOSIS — I10 Essential (primary) hypertension: Secondary | ICD-10-CM

## 2018-08-01 DIAGNOSIS — Z8719 Personal history of other diseases of the digestive system: Secondary | ICD-10-CM

## 2018-08-01 DIAGNOSIS — G8929 Other chronic pain: Secondary | ICD-10-CM

## 2018-08-01 DIAGNOSIS — M170 Bilateral primary osteoarthritis of knee: Secondary | ICD-10-CM

## 2018-08-02 DIAGNOSIS — L918 Other hypertrophic disorders of the skin: Secondary | ICD-10-CM | POA: Diagnosis not present

## 2018-08-02 DIAGNOSIS — L570 Actinic keratosis: Secondary | ICD-10-CM | POA: Diagnosis not present

## 2018-08-02 DIAGNOSIS — D1801 Hemangioma of skin and subcutaneous tissue: Secondary | ICD-10-CM | POA: Diagnosis not present

## 2018-08-02 DIAGNOSIS — L821 Other seborrheic keratosis: Secondary | ICD-10-CM | POA: Diagnosis not present

## 2018-08-02 DIAGNOSIS — L814 Other melanin hyperpigmentation: Secondary | ICD-10-CM | POA: Diagnosis not present

## 2018-08-02 DIAGNOSIS — D485 Neoplasm of uncertain behavior of skin: Secondary | ICD-10-CM | POA: Diagnosis not present

## 2018-08-07 DIAGNOSIS — M8580 Other specified disorders of bone density and structure, unspecified site: Secondary | ICD-10-CM | POA: Diagnosis not present

## 2018-08-07 DIAGNOSIS — R739 Hyperglycemia, unspecified: Secondary | ICD-10-CM | POA: Diagnosis not present

## 2018-08-07 DIAGNOSIS — E7800 Pure hypercholesterolemia, unspecified: Secondary | ICD-10-CM | POA: Diagnosis not present

## 2018-08-07 DIAGNOSIS — I10 Essential (primary) hypertension: Secondary | ICD-10-CM | POA: Diagnosis not present

## 2018-08-16 ENCOUNTER — Other Ambulatory Visit: Payer: Self-pay

## 2018-08-16 NOTE — Patient Outreach (Signed)
[facility]) Care Management  08/16/2018  Tammee Thielke [facility] 08/02/1950 [id number]   Medication Adherence call to Mrs. Natahlia Hoggard HIPPA Compliant Voice message left with a call back number. Mrs. Mckinlay is showing past due on Simvastatin 20 mg under [place].   [place] Management Direct Dial [phone]  Fax [phone] [email or web address]

## 2018-08-27 DIAGNOSIS — S70361A Insect bite (nonvenomous), right thigh, initial encounter: Secondary | ICD-10-CM | POA: Diagnosis not present

## 2018-10-24 ENCOUNTER — Other Ambulatory Visit: Payer: Self-pay

## 2018-10-24 NOTE — Patient Outreach (Signed)
[facility]) Care Management  10/24/2018  Leontyne Man[place] [facility] 1950-04-27 [id number]   Medication Adherence call to Mrs. Araiyah Cumpton HIPPA Compliant Voice message left with a call back number. Mrs. Runnion is showing past due on Candesartan/Hctz 16/12.5 mg under [employer].   [place] Management Direct Dial [phone]  Fax [phone] [email or web address]

## 2018-11-13 ENCOUNTER — Other Ambulatory Visit: Payer: Self-pay

## 2018-11-13 NOTE — Patient Outreach (Signed)
[facility]) Care Management  11/13/2018  Brooke Hurst [facility] November 08, 1950 [id number]   Medication Adherence call to Mrs. Brooke Hurst Telephone call to Patient regarding Medication Adherence unable to reach patient. Brooke Hurst is showing past due on Candesartan/Hctz 16/12.5 mg under [place].   [place] Management Direct Dial [phone]  Fax [phone] [email or web address]

## 2018-11-27 ENCOUNTER — Other Ambulatory Visit: Payer: Self-pay

## 2018-11-27 NOTE — Patient Outreach (Signed)
Brooke Hurst) Care Management  11/27/2018  Brooke Hurst [facility] 10/19/1950 [id number]   Medication Adherence call to Brooke Hurst HIPPA Compliant Voice message left with a call back number.Brooke Hurst is showing past due on Candesarta/Hctz16/12.5 mg and Simvastatin 20 mg under [facility].   [employer] Management Direct Dial [phone]  Fax [phone] [email or web address]

## 2018-12-18 DIAGNOSIS — Z23 Encounter for immunization: Secondary | ICD-10-CM | POA: Diagnosis not present

## 2019-01-09 ENCOUNTER — Other Ambulatory Visit: Payer: Self-pay | Admitting: Obstetrics & Gynecology

## 2019-01-09 DIAGNOSIS — Z1231 Encounter for screening mammogram for malignant neoplasm of breast: Secondary | ICD-10-CM

## 2019-01-24 NOTE — Progress Notes (Deleted)
   Office Visit Note  Patient: Brooke Hurst             Date of Birth: July 19, 1950           MRN: [id number]             PCP: Finis Bud, MD Referring: Finis Bud, MD Visit Date: 02/07/2019 Occupation: @GUAROCC @  Subjective:  No chief complaint on file.   History of Present Illness: Brooke Hurst is a 68 y.o. female ***   Activities of Daily Living:  Patient reports morning stiffness for *** {minute/hour:19697}.   Patient {ACTIONS;DENIES/REPORTS:21021675::"Denies"} nocturnal pain.  Difficulty dressing/grooming: {ACTIONS;DENIES/REPORTS:21021675::"Denies"} Difficulty climbing stairs: {ACTIONS;DENIES/REPORTS:21021675::"Denies"} Difficulty getting out of chair: {ACTIONS;DENIES/REPORTS:21021675::"Denies"} Difficulty using hands for taps, buttons, cutlery, and/or writing: {ACTIONS;DENIES/REPORTS:21021675::"Denies"}  No Rheumatology ROS completed.   PMFS History:  Patient Active Problem List   Diagnosis Date Noted  . Vulvar pain 09/06/2016  . Osteopenia 08/31/2011    Past Medical History:  Diagnosis Date  . Anemia   . Arthritis   . Fibroids   . Hyperlipidemia   . Hypertension     Family History  Problem Relation Age of Onset  . Diabetes Mother   . Hypertension Mother   . Diabetes Paternal Grandmother   . Diabetes Paternal Grandfather   . Heart attack Maternal Grandmother   . Cancer Maternal Grandmother        uterine  . Cancer Father        colon,liver  . Diabetes Brother   . Healthy Son   . Healthy Daughter   . Healthy Daughter    Past Surgical History:  Procedure Laterality Date  . BREAST BIOPSY Right 02/2017  . VAGINAL HYSTERECTOMY  2001   still has ovaries   Social History   Social History Narrative  . Not on file    There is no immunization history on file for this patient.   Objective: Vital Signs: There were no vitals taken for this visit.   Physical Exam   Musculoskeletal Exam: ***  CDAI Exam: CDAI Score: - Patient  Global: -; Provider Global: - Swollen: -; Tender: - Joint Exam   No joint exam has been documented for this visit   There is currently no information documented on the homunculus. Go to the Rheumatology activity and complete the homunculus joint exam.  Investigation: No additional findings.  Imaging: No results found.  Recent Labs: No results found for: WBC, HGB, PLT, NA, K, CL, CO2, GLUCOSE, BUN, CREATININE, BILITOT, ALKPHOS, AST, ALT, PROT, ALBUMIN, CALCIUM, GFRAA, QFTBGOLD, QFTBGOLDPLUS  Speciality Comments: No specialty comments available.  Procedures:  No procedures performed Allergies: Codeine   Assessment / Plan:     Visit Diagnoses: No diagnosis found.  Orders: No orders of the defined types were placed in this encounter.  No orders of the defined types were placed in this encounter.   Face-to-face time spent with patient was *** minutes. Greater than 50% of time was spent in counseling and coordination of care.  Follow-Up Instructions: No follow-ups on file.   Ofilia Neas, PA-C  Note - This record has been created using Dragon software.  Chart creation errors have been sought, but may not always  have been located. Such creation errors do not reflect on  the standard of medical care.

## 2019-02-07 ENCOUNTER — Ambulatory Visit: Payer: Medicare Other | Admitting: Physician Assistant

## 2019-02-25 ENCOUNTER — Encounter

## 2019-02-27 ENCOUNTER — Other Ambulatory Visit: Payer: Self-pay

## 2019-02-27 ENCOUNTER — Ambulatory Visit
Admission: RE | Admit: 2019-02-27 | Discharge: 2019-02-27 | Disposition: A | Discharge status: Home / Self Care | Payer: Medicare Other | Source: Ambulatory Visit | Attending: Obstetrics & Gynecology | Admitting: Obstetrics & Gynecology

## 2019-02-27 DIAGNOSIS — Z1231 Encounter for screening mammogram for malignant neoplasm of breast: Secondary | ICD-10-CM

## 2019-02-28 ENCOUNTER — Other Ambulatory Visit: Payer: Self-pay | Admitting: Obstetrics & Gynecology

## 2019-02-28 DIAGNOSIS — R928 Other abnormal and inconclusive findings on diagnostic imaging of breast: Secondary | ICD-10-CM

## 2019-03-01 ENCOUNTER — Other Ambulatory Visit: Payer: Self-pay

## 2019-03-01 NOTE — Patient Outreach (Signed)
[facility]) Care Management  03/01/2019  Gwenda Kisiel Warrior May 16, 1950 [id number]   Medication Adherence call to Mrs. Shade Harding HIPPA Compliant Voice message left with a call back number. Mrs. Deese is showing past due on Candesartan/Hctz 16/12.5 mg under [facility].   [facility] Management Direct Dial [phone]  Fax [phone] [email or web address]

## 2019-03-05 ENCOUNTER — Ambulatory Visit
Admission: RE | Admit: 2019-03-05 | Discharge: 2019-03-05 | Disposition: A | Discharge status: Home / Self Care | Payer: Medicare Other | Source: Ambulatory Visit | Attending: Obstetrics & Gynecology | Admitting: Obstetrics & Gynecology

## 2019-03-05 ENCOUNTER — Other Ambulatory Visit: Payer: Self-pay | Admitting: Obstetrics & Gynecology

## 2019-03-05 ENCOUNTER — Encounter: Payer: Self-pay | Admitting: Obstetrics & Gynecology

## 2019-03-05 ENCOUNTER — Other Ambulatory Visit: Payer: Self-pay

## 2019-03-05 DIAGNOSIS — R928 Other abnormal and inconclusive findings on diagnostic imaging of breast: Secondary | ICD-10-CM

## 2019-03-05 DIAGNOSIS — N6310 Unspecified lump in the right breast, unspecified quadrant: Secondary | ICD-10-CM | POA: Insufficient documentation

## 2019-03-05 DIAGNOSIS — N63 Unspecified lump in unspecified breast: Secondary | ICD-10-CM | POA: Insufficient documentation

## 2019-03-05 DIAGNOSIS — N6001 Solitary cyst of right breast: Secondary | ICD-10-CM

## 2019-04-01 ENCOUNTER — Ambulatory Visit: Payer: Medicare Other | Admitting: Rheumatology

## 2019-04-30 NOTE — Progress Notes (Deleted)
   Office Visit Note  Patient: Brooke Hurst             Date of Birth: 24-Jul-1950           MRN: [id number]             PCP: Finis Bud, MD Referring: Finis Bud, MD Visit Date: 05/07/2019 Occupation: @GUAROCC @  Subjective:  No chief complaint on file.   History of Present Illness: Brooke Hurst is a 69 y.o. female ***   Activities of Daily Living:  Patient reports morning stiffness for *** {minute/hour:19697}.   Patient {ACTIONS;DENIES/REPORTS:21021675::"Denies"} nocturnal pain.  Difficulty dressing/grooming: {ACTIONS;DENIES/REPORTS:21021675::"Denies"} Difficulty climbing stairs: {ACTIONS;DENIES/REPORTS:21021675::"Denies"} Difficulty getting out of chair: {ACTIONS;DENIES/REPORTS:21021675::"Denies"} Difficulty using hands for taps, buttons, cutlery, and/or writing: {ACTIONS;DENIES/REPORTS:21021675::"Denies"}  No Rheumatology ROS completed.   PMFS History:  Patient Active Problem List   Diagnosis Date Noted  . Breast mass, right 03/05/2019  . Vulvar pain 09/06/2016  . Osteopenia 08/31/2011    Past Medical History:  Diagnosis Date  . Anemia   . Arthritis   . Fibroids   . Hyperlipidemia   . Hypertension     Family History  Problem Relation Age of Onset  . Diabetes Mother   . Hypertension Mother   . Diabetes Paternal Grandmother   . Diabetes Paternal Grandfather   . Heart attack Maternal Grandmother   . Cancer Maternal Grandmother        uterine  . Cancer Father        colon,liver  . Diabetes Brother   . Healthy Son   . Healthy Daughter   . Healthy Daughter    Past Surgical History:  Procedure Laterality Date  . BREAST BIOPSY Right 02/2017  . VAGINAL HYSTERECTOMY  2001   still has ovaries   Social History   Social History Narrative  . Not on file   Immunization History  Administered Date(s) Administered  . Influenza-Unspecified 11/26/2017     Objective: Vital Signs: There were no vitals taken for this visit.    Physical Exam   Musculoskeletal Exam: ***  CDAI Exam: CDAI Score: -- Patient Global: --; Provider Global: -- Swollen: --; Tender: -- Joint Exam 05/07/2019   No joint exam has been documented for this visit   There is currently no information documented on the homunculus. Go to the Rheumatology activity and complete the homunculus joint exam.  Investigation: No additional findings.  Imaging: No results found.  Recent Labs: No results found for: WBC, HGB, PLT, NA, K, CL, CO2, GLUCOSE, BUN, CREATININE, BILITOT, ALKPHOS, AST, ALT, PROT, ALBUMIN, CALCIUM, GFRAA, QFTBGOLD, QFTBGOLDPLUS  Speciality Comments: No specialty comments available.  Procedures:  No procedures performed Allergies: Codeine   Assessment / Plan:     Visit Diagnoses: No diagnosis found.  Orders: No orders of the defined types were placed in this encounter.  No orders of the defined types were placed in this encounter.   Face-to-face time spent with patient was *** minutes. Greater than 50% of time was spent in counseling and coordination of care.  Follow-Up Instructions: No follow-ups on file.   Ofilia Neas, PA-C  Note - This record has been created using Dragon software.  Chart creation errors have been sought, but may not always  have been located. Such creation errors do not reflect on  the standard of medical care.

## 2019-05-07 ENCOUNTER — Ambulatory Visit: Payer: Medicare Other | Admitting: Physician Assistant

## 2019-05-17 NOTE — Progress Notes (Deleted)
   Office Visit Note  Patient: Brooke Hurst             Date of Birth: June 15, 1950           MRN: [id number]             PCP: Finis Bud, MD Referring: Finis Bud, MD Visit Date: 05/22/2019 Occupation: @GUAROCC @  Subjective:  No chief complaint on file.   History of Present Illness: Brooke Hurst is a 69 y.o. female ***   Activities of Daily Living:  Patient reports morning stiffness for *** {minute/hour:19697}.   Patient {ACTIONS;DENIES/REPORTS:21021675::"Denies"} nocturnal pain.  Difficulty dressing/grooming: {ACTIONS;DENIES/REPORTS:21021675::"Denies"} Difficulty climbing stairs: {ACTIONS;DENIES/REPORTS:21021675::"Denies"} Difficulty getting out of chair: {ACTIONS;DENIES/REPORTS:21021675::"Denies"} Difficulty using hands for taps, buttons, cutlery, and/or writing: {ACTIONS;DENIES/REPORTS:21021675::"Denies"}  No Rheumatology ROS completed.   PMFS History:  Patient Active Problem List   Diagnosis Date Noted  . Breast mass, right 03/05/2019  . Vulvar pain 09/06/2016  . Osteopenia 08/31/2011    Past Medical History:  Diagnosis Date  . Anemia   . Arthritis   . Fibroids   . Hyperlipidemia   . Hypertension     Family History  Problem Relation Age of Onset  . Diabetes Mother   . Hypertension Mother   . Diabetes Paternal Grandmother   . Diabetes Paternal Grandfather   . Heart attack Maternal Grandmother   . Cancer Maternal Grandmother        uterine  . Cancer Father        colon,liver  . Diabetes Brother   . Healthy Son   . Healthy Daughter   . Healthy Daughter    Past Surgical History:  Procedure Laterality Date  . BREAST BIOPSY Right 02/2017  . VAGINAL HYSTERECTOMY  2001   still has ovaries   Social History   Social History Narrative  . Not on file   Immunization History  Administered Date(s) Administered  . Influenza-Unspecified 11/26/2017     Objective: Vital Signs: There were no vitals taken for this visit.    Physical Exam   Musculoskeletal Exam: ***  CDAI Exam: CDAI Score: -- Patient Global: --; Provider Global: -- Swollen: --; Tender: -- Joint Exam 05/22/2019   No joint exam has been documented for this visit   There is currently no information documented on the homunculus. Go to the Rheumatology activity and complete the homunculus joint exam.  Investigation: No additional findings.  Imaging: No results found.  Recent Labs: No results found for: WBC, HGB, PLT, NA, K, CL, CO2, GLUCOSE, BUN, CREATININE, BILITOT, ALKPHOS, AST, ALT, PROT, ALBUMIN, CALCIUM, GFRAA, QFTBGOLD, QFTBGOLDPLUS  Speciality Comments: No specialty comments available.  Procedures:  No procedures performed Allergies: Codeine   Assessment / Plan:     Visit Diagnoses: No diagnosis found.  Orders: No orders of the defined types were placed in this encounter.  No orders of the defined types were placed in this encounter.   Face-to-face time spent with patient was *** minutes. Greater than 50% of time was spent in counseling and coordination of care.  Follow-Up Instructions: No follow-ups on file.   Earnestine Mealing, CMA  Note - This record has been created using Editor, commissioning.  Chart creation errors have been sought, but may not always  have been located. Such creation errors do not reflect on  the standard of medical care.

## 2019-05-22 ENCOUNTER — Ambulatory Visit: Payer: Medicare Other | Admitting: Rheumatology

## 2019-06-20 DIAGNOSIS — H25013 Cortical age-related cataract, bilateral: Secondary | ICD-10-CM | POA: Diagnosis not present

## 2019-06-24 DIAGNOSIS — H1132 Conjunctival hemorrhage, left eye: Secondary | ICD-10-CM | POA: Diagnosis not present

## 2019-06-27 NOTE — Progress Notes (Deleted)
   Office Visit Note  Patient: Brooke Hurst             Date of Birth: 11-29-1950           MRN: [id number]             PCP: Finis Bud, MD Referring: Finis Bud, MD Visit Date: 07/04/2019 Occupation: @GUAROCC @  Subjective:  No chief complaint on file.   History of Present Illness: Brooke Hurst is a 69 y.o. female ***   Activities of Daily Living:  Patient reports morning stiffness for *** {minute/hour:19697}.   Patient {ACTIONS;DENIES/REPORTS:21021675::"Denies"} nocturnal pain.  Difficulty dressing/grooming: {ACTIONS;DENIES/REPORTS:21021675::"Denies"} Difficulty climbing stairs: {ACTIONS;DENIES/REPORTS:21021675::"Denies"} Difficulty getting out of chair: {ACTIONS;DENIES/REPORTS:21021675::"Denies"} Difficulty using hands for taps, buttons, cutlery, and/or writing: {ACTIONS;DENIES/REPORTS:21021675::"Denies"}  No Rheumatology ROS completed.   PMFS History:  Patient Active Problem List   Diagnosis Date Noted  . Breast mass, right 03/05/2019  . Vulvar pain 09/06/2016  . Osteopenia 08/31/2011    Past Medical History:  Diagnosis Date  . Anemia   . Arthritis   . Fibroids   . Hyperlipidemia   . Hypertension     Family History  Problem Relation Age of Onset  . Diabetes Mother   . Hypertension Mother   . Diabetes Paternal Grandmother   . Diabetes Paternal Grandfather   . Heart attack Maternal Grandmother   . Cancer Maternal Grandmother        uterine  . Cancer Father        colon,liver  . Diabetes Brother   . Healthy Son   . Healthy Daughter   . Healthy Daughter    Past Surgical History:  Procedure Laterality Date  . BREAST BIOPSY Right 02/2017  . VAGINAL HYSTERECTOMY  2001   still has ovaries   Social History   Social History Narrative  . Not on file   Immunization History  Administered Date(s) Administered  . Influenza-Unspecified 11/26/2017     Objective: Vital Signs: There were no vitals taken for this visit.    Physical Exam   Musculoskeletal Exam: ***  CDAI Exam: CDAI Score: - Patient Global: -; Provider Global: - Swollen: -; Tender: - Joint Exam 07/04/2019   No joint exam has been documented for this visit   There is currently no information documented on the homunculus. Go to the Rheumatology activity and complete the homunculus joint exam.  Investigation: No additional findings.  Imaging: No results found.  Recent Labs: No results found for: WBC, HGB, PLT, NA, K, CL, CO2, GLUCOSE, BUN, CREATININE, BILITOT, ALKPHOS, AST, ALT, PROT, ALBUMIN, CALCIUM, GFRAA, QFTBGOLD, QFTBGOLDPLUS  Speciality Comments: No specialty comments available.  Procedures:  No procedures performed Allergies: Codeine   Assessment / Plan:     Visit Diagnoses: No diagnosis found.  Orders: No orders of the defined types were placed in this encounter.  No orders of the defined types were placed in this encounter.   Face-to-face time spent with patient was *** minutes. Greater than 50% of time was spent in counseling and coordination of care.  Follow-Up Instructions: No follow-ups on file.   Earnestine Mealing, CMA  Note - This record has been created using Editor, commissioning.  Chart creation errors have been sought, but may not always  have been located. Such creation errors do not reflect on  the standard of medical care.

## 2019-07-04 ENCOUNTER — Ambulatory Visit: Payer: Medicare Other | Admitting: Physician Assistant

## 2019-08-06 NOTE — Progress Notes (Deleted)
   Office Visit Note  Patient: Brooke Hurst             Date of Birth: 1951/03/18           MRN: [id number]             PCP: Finis Bud, MD Referring: Finis Bud, MD Visit Date: 08/14/2019 Occupation: @GUAROCC @  Subjective:  No chief complaint on file.   History of Present Illness: Brooke Hurst is a 69 y.o. female ***   Activities of Daily Living:  Patient reports morning stiffness for *** {minute/hour:19697}.   Patient {ACTIONS;DENIES/REPORTS:21021675::"Denies"} nocturnal pain.  Difficulty dressing/grooming: {ACTIONS;DENIES/REPORTS:21021675::"Denies"} Difficulty climbing stairs: {ACTIONS;DENIES/REPORTS:21021675::"Denies"} Difficulty getting out of chair: {ACTIONS;DENIES/REPORTS:21021675::"Denies"} Difficulty using hands for taps, buttons, cutlery, and/or writing: {ACTIONS;DENIES/REPORTS:21021675::"Denies"}  No Rheumatology ROS completed.   PMFS History:  Patient Active Problem List   Diagnosis Date Noted  . Breast mass, right 03/05/2019  . Vulvar pain 09/06/2016  . Osteopenia 08/31/2011    Past Medical History:  Diagnosis Date  . Anemia   . Arthritis   . Fibroids   . Hyperlipidemia   . Hypertension     Family History  Problem Relation Age of Onset  . Diabetes Mother   . Hypertension Mother   . Diabetes Paternal Grandmother   . Diabetes Paternal Grandfather   . Heart attack Maternal Grandmother   . Cancer Maternal Grandmother        uterine  . Cancer Father        colon,liver  . Diabetes Brother   . Healthy Son   . Healthy Daughter   . Healthy Daughter    Past Surgical History:  Procedure Laterality Date  . BREAST BIOPSY Right 02/2017  . VAGINAL HYSTERECTOMY  2001   still has ovaries   Social History   Social History Narrative  . Not on file   Immunization History  Administered Date(s) Administered  . Influenza-Unspecified 11/26/2017     Objective: Vital Signs: There were no vitals taken for this visit.    Physical Exam   Musculoskeletal Exam: ***  CDAI Exam: CDAI Score: -- Patient Global: --; Provider Global: -- Swollen: --; Tender: -- Joint Exam 08/14/2019   No joint exam has been documented for this visit   There is currently no information documented on the homunculus. Go to the Rheumatology activity and complete the homunculus joint exam.  Investigation: No additional findings.  Imaging: No results found.  Recent Labs: No results found for: WBC, HGB, PLT, NA, K, CL, CO2, GLUCOSE, BUN, CREATININE, BILITOT, ALKPHOS, AST, ALT, PROT, ALBUMIN, CALCIUM, GFRAA, QFTBGOLD, QFTBGOLDPLUS  Speciality Comments: No specialty comments available.  Procedures:  No procedures performed Allergies: Codeine   Assessment / Plan:     Visit Diagnoses: No diagnosis found.  Orders: No orders of the defined types were placed in this encounter.  No orders of the defined types were placed in this encounter.   Face-to-face time spent with patient was *** minutes. Greater than 50% of time was spent in counseling and coordination of care.  Follow-Up Instructions: No follow-ups on file.   Earnestine Mealing, CMA  Note - This record has been created using Editor, commissioning.  Chart creation errors have been sought, but may not always  have been located. Such creation errors do not reflect on  the standard of medical care.

## 2019-08-14 ENCOUNTER — Ambulatory Visit: Payer: Medicare Other | Admitting: Physician Assistant

## 2019-08-14 DIAGNOSIS — Z1589 Genetic susceptibility to other disease: Secondary | ICD-10-CM

## 2019-08-14 DIAGNOSIS — M19041 Primary osteoarthritis, right hand: Secondary | ICD-10-CM

## 2019-08-14 DIAGNOSIS — Z8739 Personal history of other diseases of the musculoskeletal system and connective tissue: Secondary | ICD-10-CM

## 2019-08-14 DIAGNOSIS — M5136 Other intervertebral disc degeneration, lumbar region: Secondary | ICD-10-CM

## 2019-08-14 DIAGNOSIS — G8929 Other chronic pain: Secondary | ICD-10-CM

## 2019-08-14 DIAGNOSIS — M170 Bilateral primary osteoarthritis of knee: Secondary | ICD-10-CM

## 2019-08-20 DIAGNOSIS — E7800 Pure hypercholesterolemia, unspecified: Secondary | ICD-10-CM | POA: Diagnosis not present

## 2019-08-20 DIAGNOSIS — M5136 Other intervertebral disc degeneration, lumbar region: Secondary | ICD-10-CM | POA: Diagnosis not present

## 2019-08-20 DIAGNOSIS — I10 Essential (primary) hypertension: Secondary | ICD-10-CM | POA: Diagnosis not present

## 2019-09-04 ENCOUNTER — Other Ambulatory Visit: Payer: Self-pay | Admitting: Obstetrics & Gynecology

## 2019-09-04 ENCOUNTER — Ambulatory Visit
Admission: RE | Admit: 2019-09-04 | Discharge: 2019-09-04 | Disposition: A | Discharge status: Home / Self Care | Payer: Medicare Other | Source: Ambulatory Visit | Attending: Obstetrics & Gynecology | Admitting: Obstetrics & Gynecology

## 2019-09-04 ENCOUNTER — Other Ambulatory Visit: Payer: Self-pay

## 2019-09-04 DIAGNOSIS — R928 Other abnormal and inconclusive findings on diagnostic imaging of breast: Secondary | ICD-10-CM | POA: Diagnosis not present

## 2019-09-04 DIAGNOSIS — N6001 Solitary cyst of right breast: Secondary | ICD-10-CM | POA: Diagnosis not present

## 2019-09-04 NOTE — Progress Notes (Deleted)
   Office Visit Note  Patient: Brooke Hurst             Date of Birth: 06-Jan-1951           MRN: [id number]             PCP: Finis Bud, MD Referring: Finis Bud, MD Visit Date: 09/17/2019 Occupation: @GUAROCC @  Subjective:  No chief complaint on file.   History of Present Illness: Brooke Hurst is a 69 y.o. female ***   Activities of Daily Living:  Patient reports morning stiffness for *** {minute/hour:19697}.   Patient {ACTIONS;DENIES/REPORTS:21021675::"Denies"} nocturnal pain.  Difficulty dressing/grooming: {ACTIONS;DENIES/REPORTS:21021675::"Denies"} Difficulty climbing stairs: {ACTIONS;DENIES/REPORTS:21021675::"Denies"} Difficulty getting out of chair: {ACTIONS;DENIES/REPORTS:21021675::"Denies"} Difficulty using hands for taps, buttons, cutlery, and/or writing: {ACTIONS;DENIES/REPORTS:21021675::"Denies"}  No Rheumatology ROS completed.   PMFS History:  Patient Active Problem List   Diagnosis Date Noted  . Breast mass, right 03/05/2019  . Vulvar pain 09/06/2016  . Osteopenia 08/31/2011    Past Medical History:  Diagnosis Date  . Anemia   . Arthritis   . Fibroids   . Hyperlipidemia   . Hypertension     Family History  Problem Relation Age of Onset  . Diabetes Mother   . Hypertension Mother   . Diabetes Paternal Grandmother   . Diabetes Paternal Grandfather   . Heart attack Maternal Grandmother   . Cancer Maternal Grandmother        uterine  . Cancer Father        colon,liver  . Diabetes Brother   . Healthy Son   . Healthy Daughter   . Healthy Daughter    Past Surgical History:  Procedure Laterality Date  . BREAST BIOPSY Right 02/2017  . VAGINAL HYSTERECTOMY  2001   still has ovaries   Social History   Social History Narrative  . Not on file   Immunization History  Administered Date(s) Administered  . Influenza-Unspecified 11/26/2017     Objective: Vital Signs: There were no vitals taken for this visit.    Physical Exam   Musculoskeletal Exam: ***  CDAI Exam: CDAI Score: -- Patient Global: --; Provider Global: -- Swollen: --; Tender: -- Joint Exam 09/17/2019   No joint exam has been documented for this visit   There is currently no information documented on the homunculus. Go to the Rheumatology activity and complete the homunculus joint exam.  Investigation: No additional findings.  Imaging: No results found.  Recent Labs: No results found for: WBC, HGB, PLT, NA, K, CL, CO2, GLUCOSE, BUN, CREATININE, BILITOT, ALKPHOS, AST, ALT, PROT, ALBUMIN, CALCIUM, GFRAA, QFTBGOLD, QFTBGOLDPLUS  Speciality Comments: No specialty comments available.  Procedures:  No procedures performed Allergies: Codeine   Assessment / Plan:     Visit Diagnoses: No diagnosis found.  Orders: No orders of the defined types were placed in this encounter.  No orders of the defined types were placed in this encounter.   Face-to-face time spent with patient was *** minutes. Greater than 50% of time was spent in counseling and coordination of care.  Follow-Up Instructions: No follow-ups on file.   Earnestine Mealing, CMA  Note - This record has been created using Editor, commissioning.  Chart creation errors have been sought, but may not always  have been located. Such creation errors do not reflect on  the standard of medical care.

## 2019-09-17 ENCOUNTER — Ambulatory Visit: Payer: Medicare Other | Admitting: Physician Assistant

## 2020-02-25 DIAGNOSIS — M8580 Other specified disorders of bone density and structure, unspecified site: Secondary | ICD-10-CM | POA: Diagnosis not present

## 2020-02-25 DIAGNOSIS — Z0000 Encounter for general adult medical examination without abnormal findings: Secondary | ICD-10-CM | POA: Diagnosis not present

## 2020-02-25 DIAGNOSIS — E7800 Pure hypercholesterolemia, unspecified: Secondary | ICD-10-CM | POA: Diagnosis not present

## 2020-02-25 DIAGNOSIS — Z23 Encounter for immunization: Secondary | ICD-10-CM | POA: Diagnosis not present

## 2020-02-25 DIAGNOSIS — I10 Essential (primary) hypertension: Secondary | ICD-10-CM | POA: Diagnosis not present

## 2020-03-06 ENCOUNTER — Ambulatory Visit
Admission: RE | Admit: 2020-03-06 | Discharge: 2020-03-06 | Disposition: A | Discharge status: Home / Self Care | Payer: Medicare Other | Source: Ambulatory Visit | Attending: Obstetrics & Gynecology | Admitting: Obstetrics & Gynecology

## 2020-03-06 ENCOUNTER — Other Ambulatory Visit: Payer: Self-pay | Admitting: Obstetrics & Gynecology

## 2020-03-06 ENCOUNTER — Other Ambulatory Visit: Payer: Self-pay

## 2020-03-06 DIAGNOSIS — N6001 Solitary cyst of right breast: Secondary | ICD-10-CM

## 2020-03-28 HISTORY — PX: OTHER SURGICAL HISTORY: SHX169

## 2020-03-28 HISTORY — PX: REPLACEMENT TOTAL KNEE BILATERAL: SUR1225

## 2020-09-07 ENCOUNTER — Ambulatory Visit
Admission: RE | Admit: 2020-09-07 | Discharge: 2020-09-07 | Disposition: A | Discharge status: Home / Self Care | Payer: Medicare Other | Source: Ambulatory Visit | Attending: Obstetrics & Gynecology | Admitting: Obstetrics & Gynecology

## 2020-09-07 ENCOUNTER — Other Ambulatory Visit: Payer: Self-pay

## 2020-09-07 ENCOUNTER — Other Ambulatory Visit: Payer: Self-pay | Admitting: Obstetrics & Gynecology

## 2020-09-07 DIAGNOSIS — N6001 Solitary cyst of right breast: Secondary | ICD-10-CM

## 2021-02-01 IMAGING — US US BREAST*R* LIMITED INC AXILLA
1 series · 13 of 18 positions shown · non-contrast
Comparison: Previous exam(s).

CLINICAL DATA: 69-year-old female presenting for first six-month
follow-up of a probably benign right breast mass.

EXAM:
DIGITAL DIAGNOSTIC RIGHT MAMMOGRAM WITH CAD AND TOMO
ULTRASOUND RIGHT BREAST

[Series 1: us breast*right* limited inc axilla · 0.06mm/px · 13 of 18 slices shown]
[im 1/18]
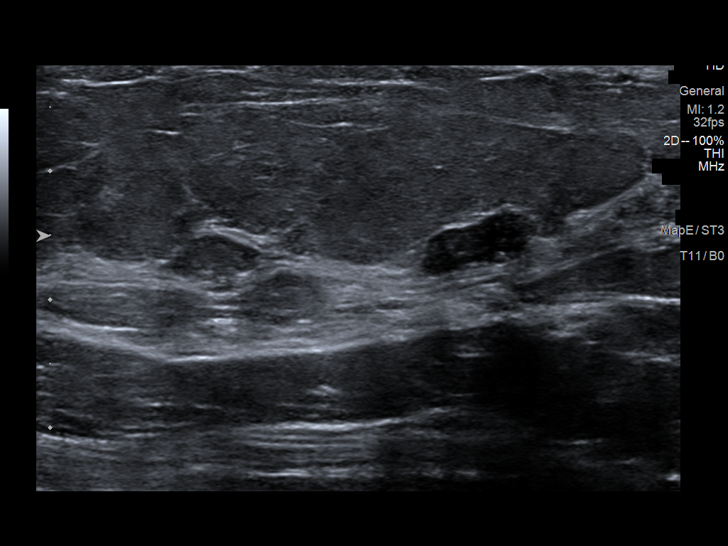
[im 3/18]
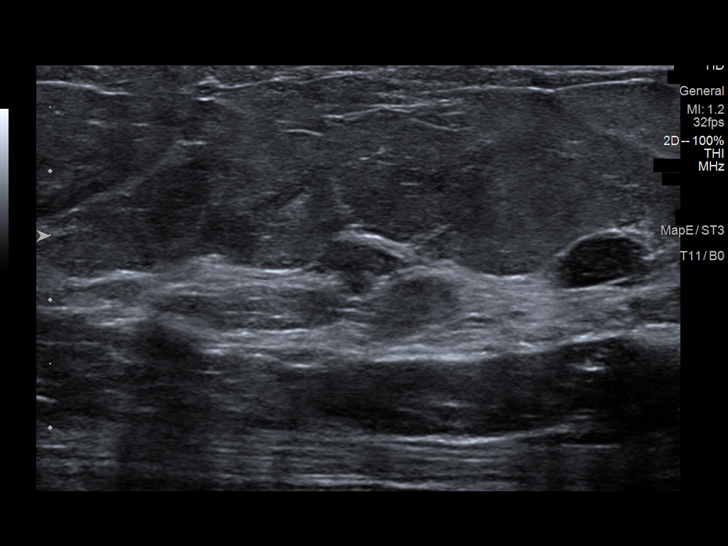
[im 4/18]
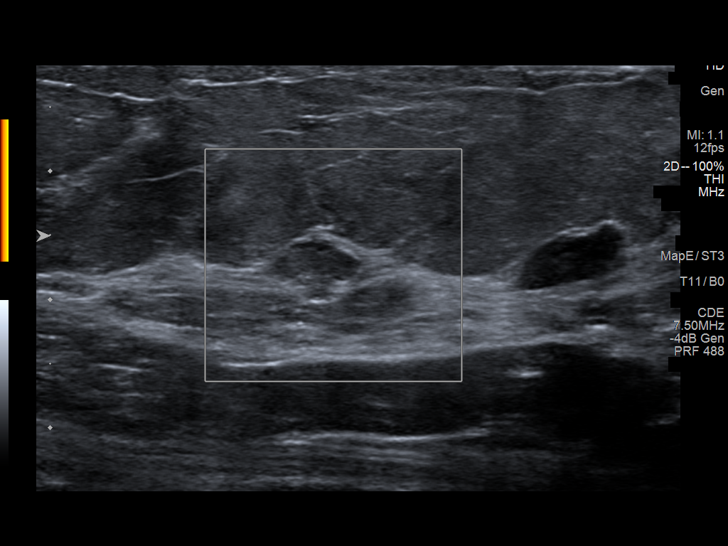
[im 5/18]
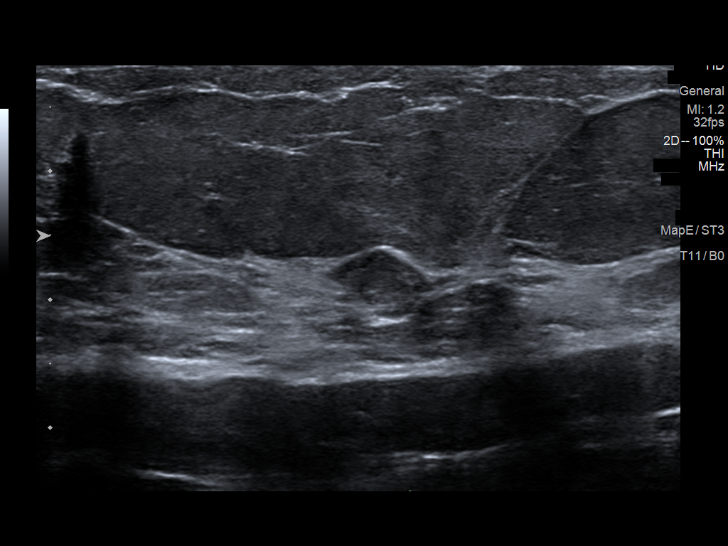
[im 7/18]
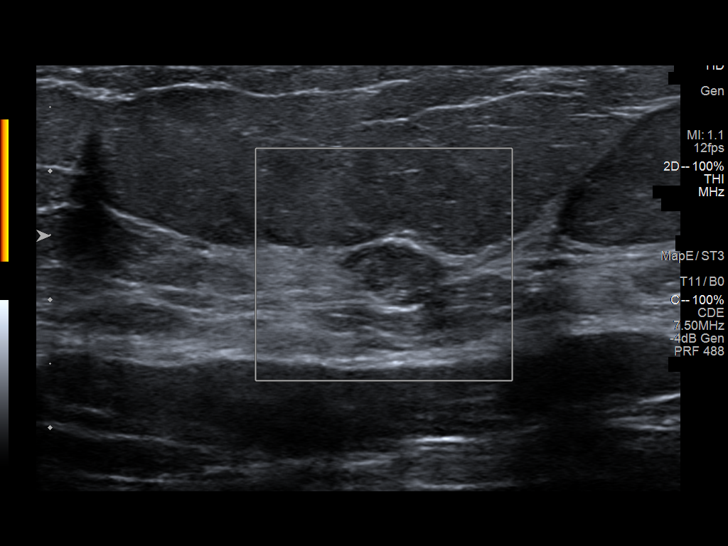
[im 8/18]
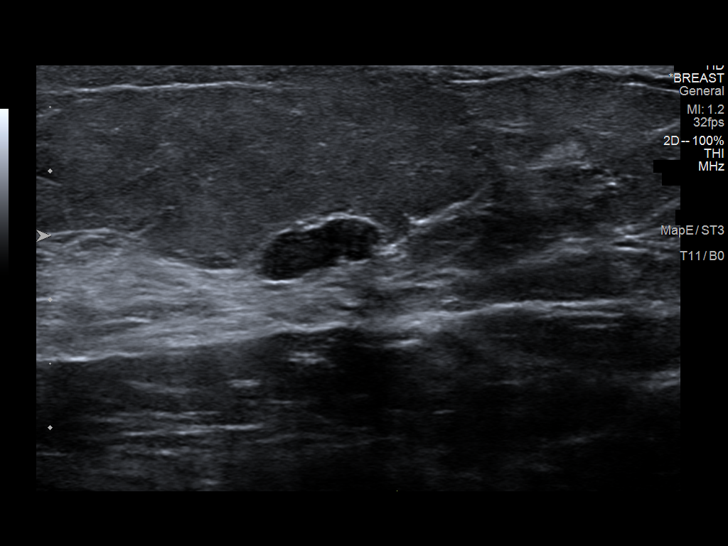
[im 10/18]
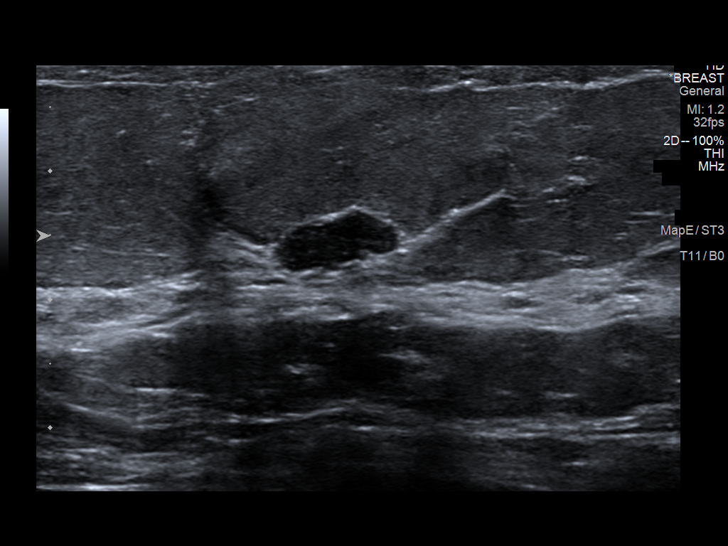
[im 11/18]
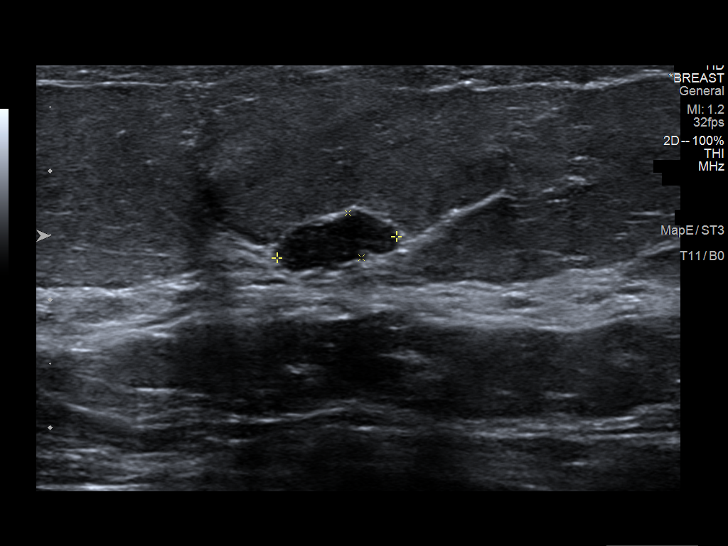
[im 12/18]
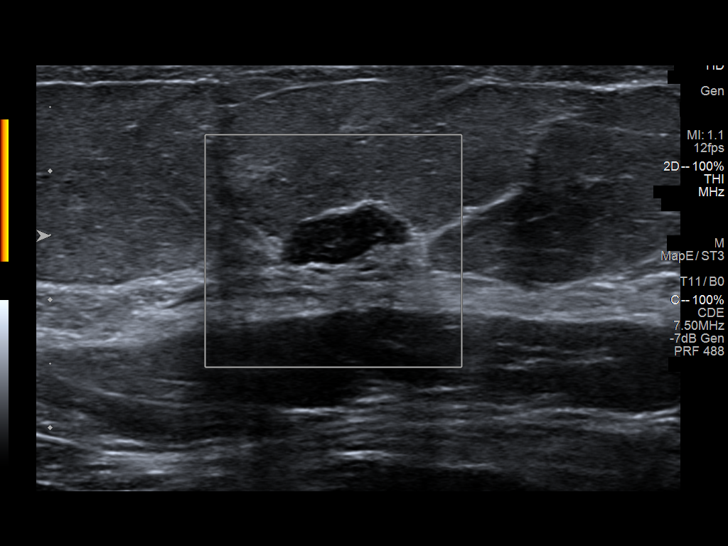
[im 14/18]
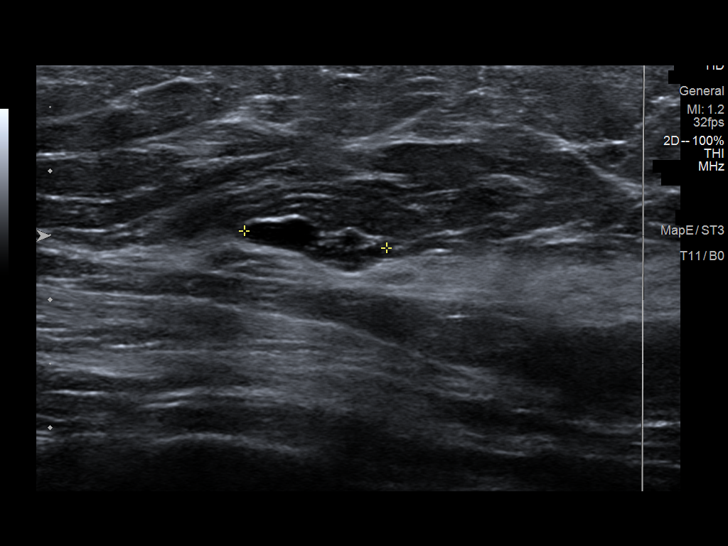
[im 15/18]
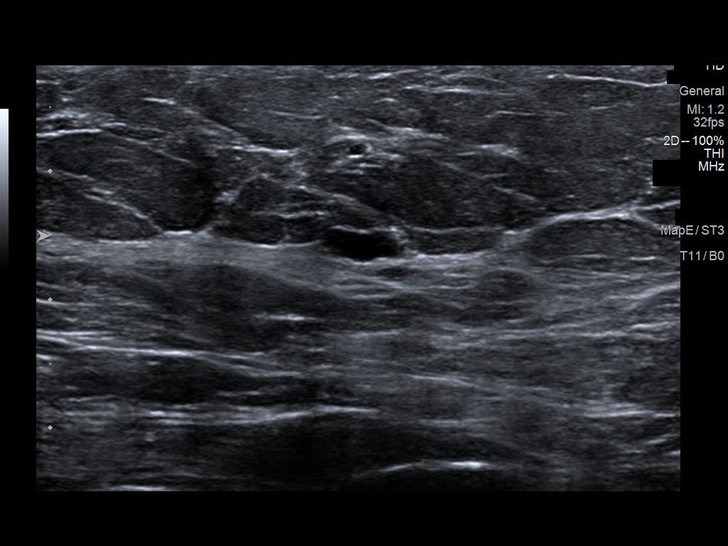
[im 16/18]
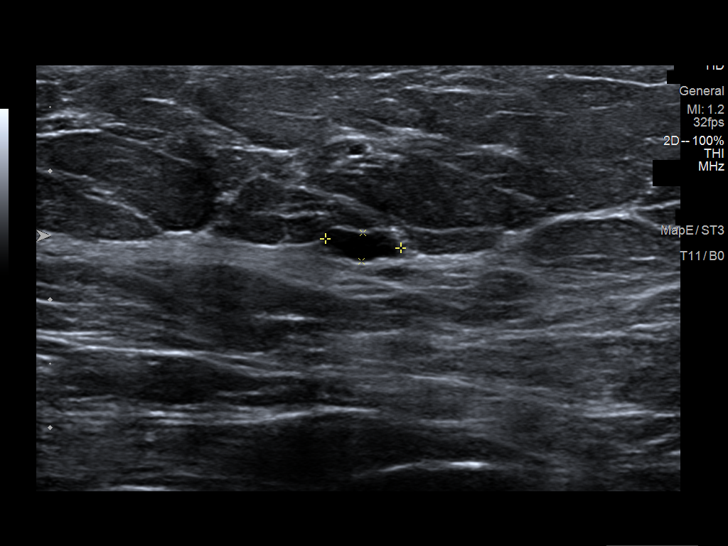
[im 18/18]
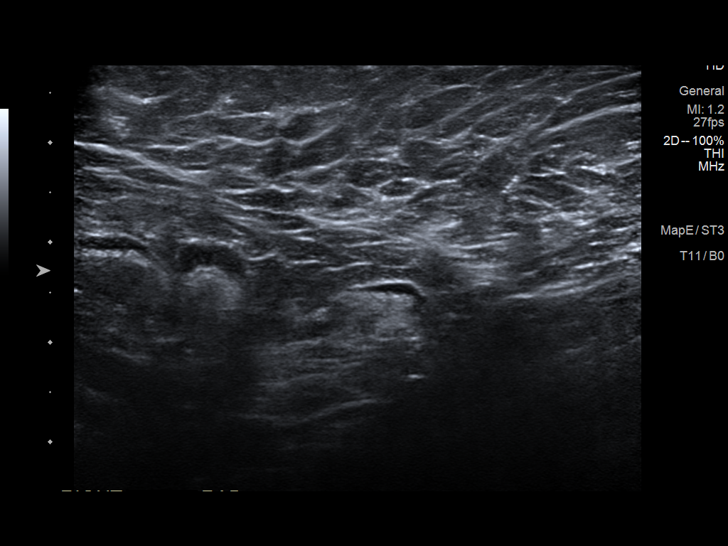

[13 of 18 positions shown; findings below may reference images not displayed]

ACR Breast Density Category b: There are scattered areas of
fibroglandular density.
FINDINGS: Stable appearance of partially obscured masses in the upper outer
right breast at anterior depth. These areas are partially seen
dating back to 5910. No additional suspicious findings are
identified.

Mammographic images were processed with CAD.

Targeted ultrasound is performed, showing an oval, circumscribed
hypoechoic mass at the 11 o'clock position 4 cm from the nipple. It
measures 7 x 7 x 5 mm and likely corresponds with the mass seen in
the upper outer quadrant at middle depth. An oval, circumscribed
hypoechoic mass is also noted at the 11 o'clock position 3 cm from
the nipple. This measures 10 x 9 x 4 mm. This likely corresponds
with a slightly more anterior mass seen dating back to 5910.

At the 10 o'clock position 7 cm from the nipple, there is stable
appearance of a small multi-cystic mass, now with an eccentric,
cystic component. Overall measurements are 11 x 6 x 2 mm. There is
no internal vascularity.
IMPRESSION: Three probably benign right breast masses corresponding with stable
mammographic findings. Recommendation is for continued short-term
imaging follow-up.

RECOMMENDATION:
Bilateral diagnostic mammogram and right breast ultrasound in 6
months.

I have discussed the findings and recommendations with the patient.
If applicable, a reminder letter will be sent to the patient
regarding the next appointment.

BI-RADS CATEGORY  3: Probably benign.

## 2021-03-10 ENCOUNTER — Ambulatory Visit
Admission: RE | Admit: 2021-03-10 | Discharge: 2021-03-10 | Disposition: A | Discharge status: Home / Self Care | Payer: Medicare Other | Source: Ambulatory Visit | Attending: Obstetrics & Gynecology | Admitting: Obstetrics & Gynecology

## 2021-03-10 DIAGNOSIS — N6001 Solitary cyst of right breast: Secondary | ICD-10-CM

## 2021-08-05 DIAGNOSIS — E7800 Pure hypercholesterolemia, unspecified: Secondary | ICD-10-CM | POA: Insufficient documentation

## 2021-08-18 ENCOUNTER — Other Ambulatory Visit: Payer: Self-pay

## 2022-02-09 ENCOUNTER — Other Ambulatory Visit: Payer: Self-pay | Admitting: Obstetrics & Gynecology

## 2022-02-09 DIAGNOSIS — Z1231 Encounter for screening mammogram for malignant neoplasm of breast: Secondary | ICD-10-CM

## 2022-03-29 ENCOUNTER — Ambulatory Visit
Admission: RE | Admit: 2022-03-29 | Discharge: 2022-03-29 | Disposition: A | Discharge status: Home / Self Care | Payer: Medicare Other | Source: Ambulatory Visit | Attending: Obstetrics & Gynecology | Admitting: Obstetrics & Gynecology

## 2022-03-29 DIAGNOSIS — Z1231 Encounter for screening mammogram for malignant neoplasm of breast: Secondary | ICD-10-CM

## 2022-08-08 IMAGING — US US BREAST*R* LIMITED INC AXILLA
1 series · 13 of 15 positions shown · non-contrast
Comparison: Previous exam(s).

CLINICAL DATA: Patient returns today to evaluate probably benign
masses in the RIGHT breast.

EXAM:
DIGITAL DIAGNOSTIC BILATERAL MAMMOGRAM WITH TOMOSYNTHESIS AND CAD;
ULTRASOUND RIGHT BREAST LIMITED
TECHNIQUE: Bilateral digital diagnostic mammography and breast tomosynthesis
was performed. The images were evaluated with computer-aided
detection.; Targeted ultrasound examination of the right breast was
performed

[Series 1: us breast*right* limited inc axilla · 0.06mm/px · 13 of 15 slices shown]
[im 1/15]
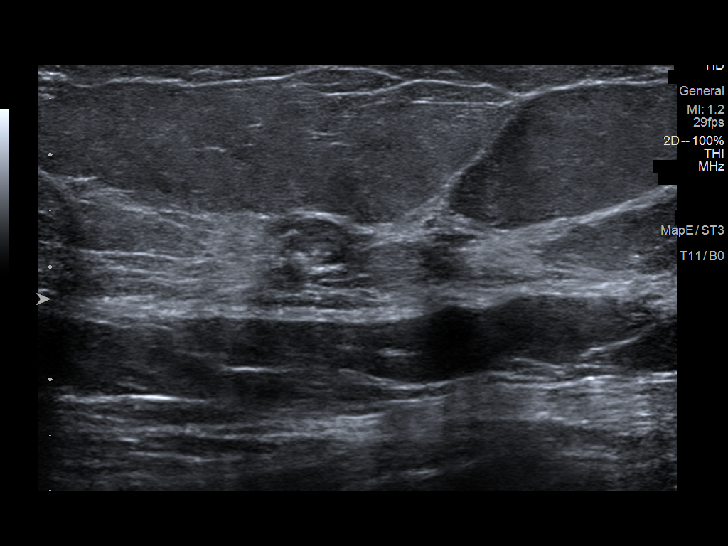
[im 2/15]
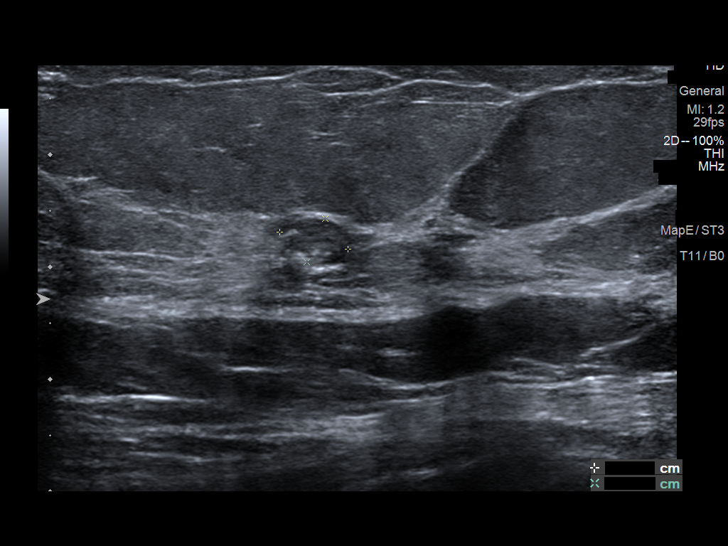
[im 3/15]
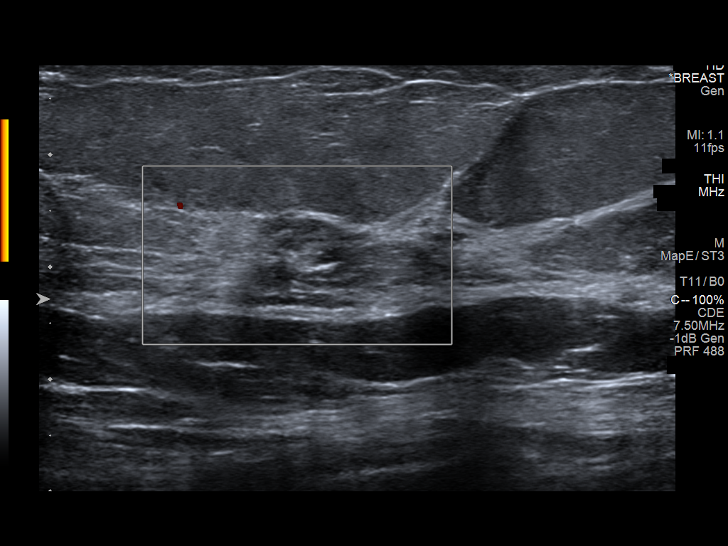
[im 5/15]
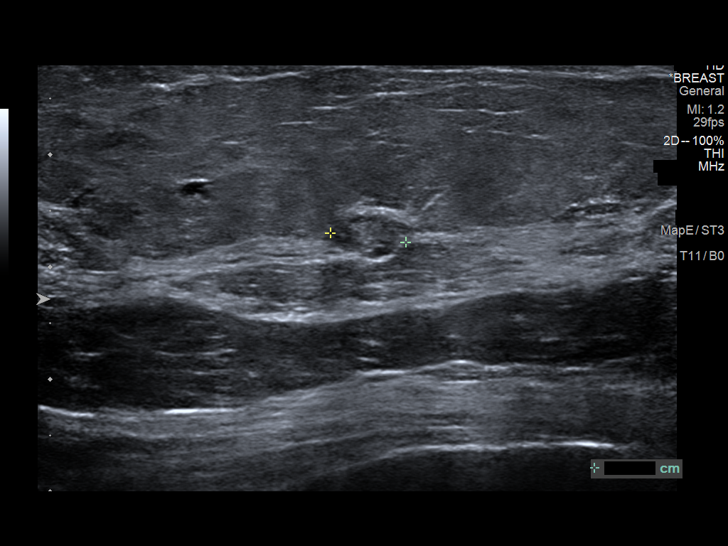
[im 6/15]
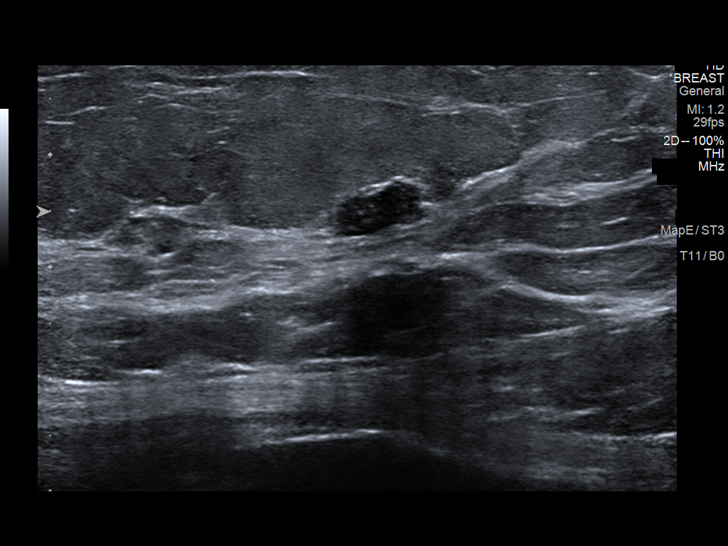
[im 7/15]
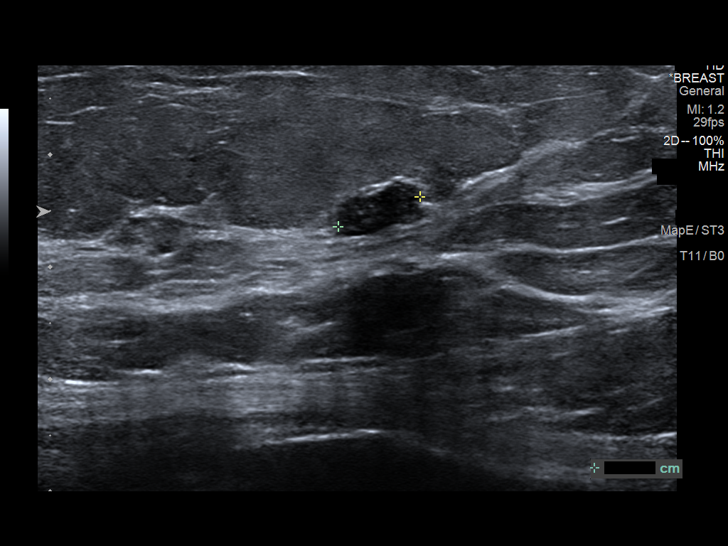
[im 8/15]
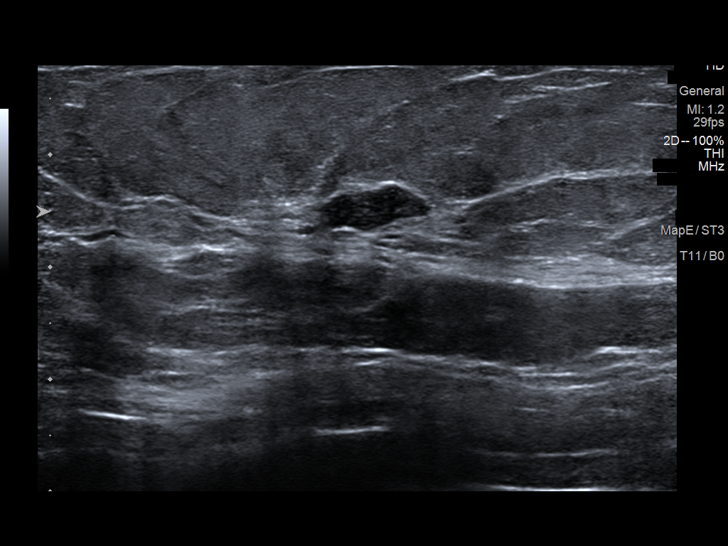
[im 9/15]
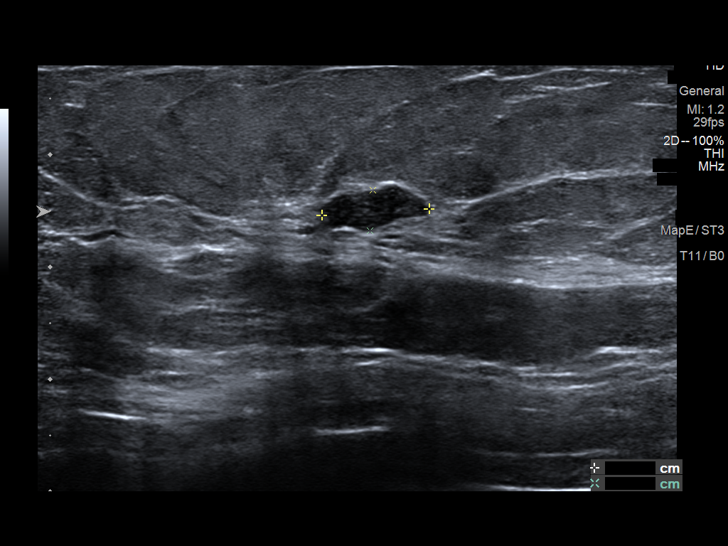
[im 10/15]
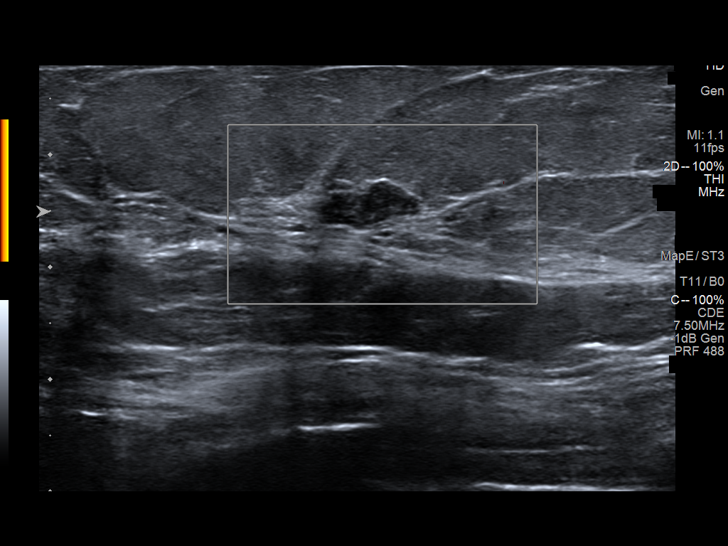
[im 11/15]
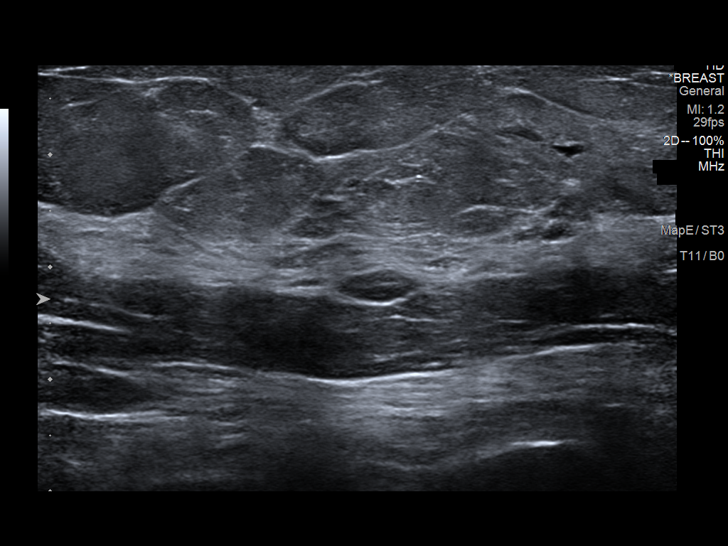
[im 13/15]
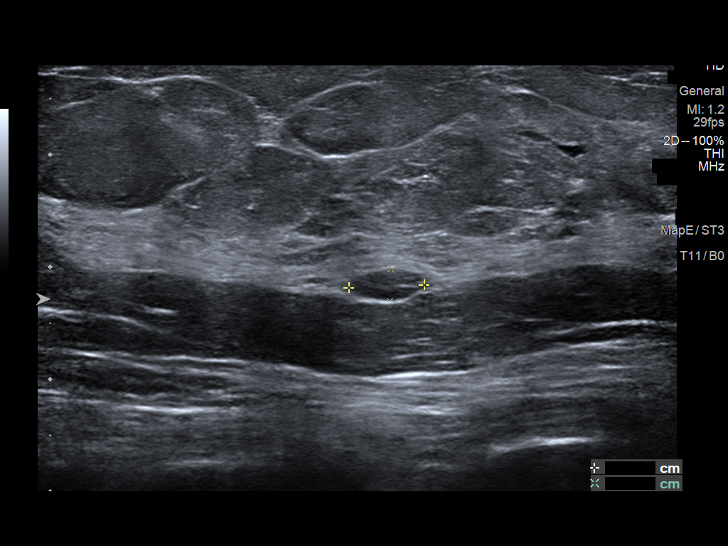
[im 14/15]
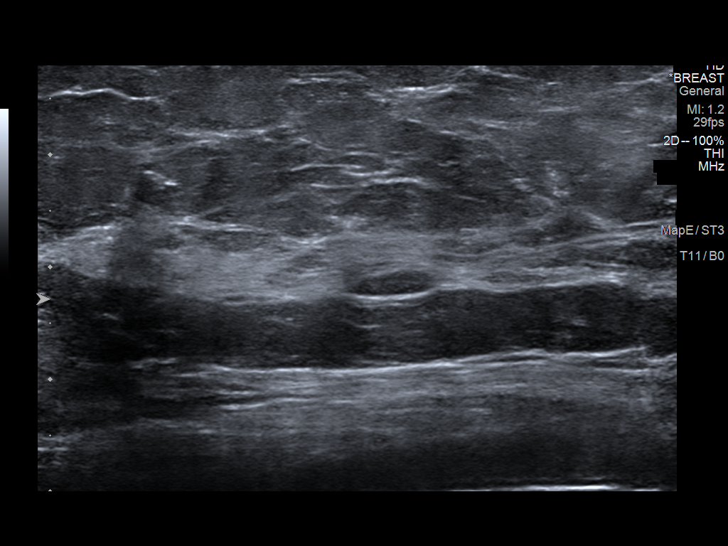
[im 15/15]
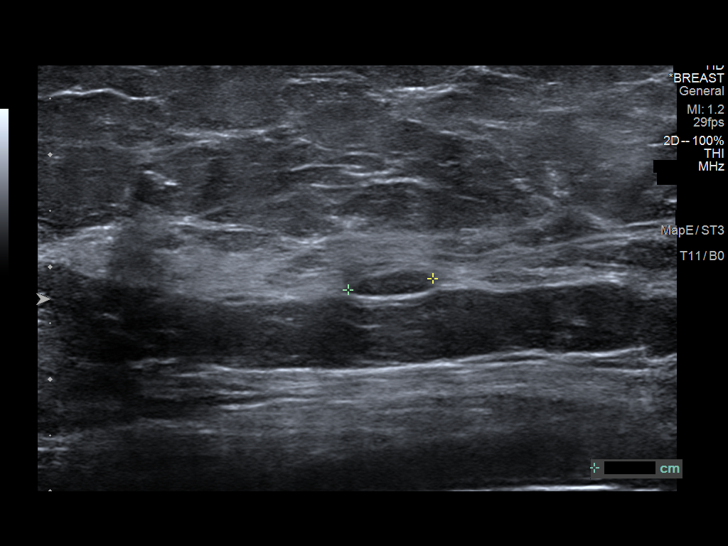

[13 of 15 positions shown; findings below may reference images not displayed]

ACR Breast Density Category c: The breast tissue is heterogeneously
dense, which may obscure small masses.
FINDINGS: The partially obscured masses within the outer RIGHT breast appear
stable. There are no new dominant masses, suspicious calcifications
or secondary signs of malignancy within either breast.

Targeted ultrasound is performed, again showing the 3 hypoechoic
masses in the RIGHT breast at the 10-11 o'clock axes, each stable or
slightly smaller compared to the previous exams confirming
benignity. No suspicious solid or cystic mass is seen by ultrasound
today.
IMPRESSION: No evidence of malignancy within either breast. Benign masses within
the RIGHT breast, each now without significant change for nearly 2
years or longer confirming benignity. No additional follow-up
diagnostic imaging is needed for these benign findings.

Patient may return to routine annual bilateral screening mammogram
schedule.

RECOMMENDATION:
Screening mammogram in one year.(Code:[id number]-[id number])

I have discussed the findings and recommendations with the patient.
If applicable, a reminder letter will be sent to the patient
regarding the next appointment.

BI-RADS CATEGORY  2: Benign.

## 2023-04-29 HISTORY — PX: BACK SURGERY: SHX140

## 2023-12-06 ENCOUNTER — Other Ambulatory Visit: Payer: Self-pay | Admitting: Obstetrics & Gynecology

## 2023-12-06 DIAGNOSIS — E66811 Obesity, class 1: Secondary | ICD-10-CM | POA: Insufficient documentation

## 2023-12-06 DIAGNOSIS — Z1231 Encounter for screening mammogram for malignant neoplasm of breast: Secondary | ICD-10-CM

## 2023-12-15 ENCOUNTER — Ambulatory Visit
Admission: RE | Admit: 2023-12-15 | Discharge: 2023-12-15 | Disposition: A | Discharge status: Home / Self Care | Source: Ambulatory Visit | Attending: Obstetrics & Gynecology | Admitting: Obstetrics & Gynecology

## 2023-12-15 DIAGNOSIS — Z1231 Encounter for screening mammogram for malignant neoplasm of breast: Secondary | ICD-10-CM

## 2023-12-28 ENCOUNTER — Ambulatory Visit: Payer: Self-pay | Admitting: Obstetrics & Gynecology

## 2024-01-01 ENCOUNTER — Ambulatory Visit: Admitting: Obstetrics & Gynecology

## 2024-01-01 ENCOUNTER — Encounter: Payer: Self-pay | Admitting: Obstetrics & Gynecology

## 2024-01-01 VITALS — BP 132/87 | HR 92 | Ht 59.0 in | Wt 150.0 lb

## 2024-01-01 DIAGNOSIS — N8110 Cystocele, unspecified: Secondary | ICD-10-CM | POA: Diagnosis not present

## 2024-01-01 DIAGNOSIS — N952 Postmenopausal atrophic vaginitis: Secondary | ICD-10-CM

## 2024-01-01 MED ORDER — ESTRADIOL 0.1 MG/GM VA CREA: TOPICAL_CREAM | VAGINAL | 1 refills | Status: AC

## 2024-01-01 NOTE — Progress Notes (Signed)
   Subjective:    Patient ID: Brooke Hurst, female    DOB: 05-13-1950, 73 y.o.   MRN: [id number]  HPI  73 yo female presents for feeling a bulge in vagina for past 3 months.  NO pain or concerns.  Not sexually active.  No urinary issues other than stream seems to last a little longer.  She feels like she is emptying her bladder.     Review of Systems  Constitutional: Negative.   Respiratory: Negative.    Cardiovascular: Negative.   Gastrointestinal: Negative.   Genitourinary:        Bulge in lower vagina, burning at introitus       Objective:   Physical Exam Vitals reviewed.  Constitutional:      General: She is not in acute distress.    Appearance: She is well-developed.  HENT:     Head: Normocephalic and atraumatic.  Eyes:     Conjunctiva/sclera: Conjunctivae normal.  Cardiovascular:     Rate and Rhythm: Normal rate.  Pulmonary:     Effort: Pulmonary effort is normal.  Abdominal:     General: Abdomen is flat.     Palpations: Abdomen is soft.  Genitourinary:    Comments: Tanner V Vulva:  No lesion Vagina:  Pale pink, atrophic; mild to moderate bladder prolpase; no apical prolapse appreciated Cervix:  Surgically absent Uterus:  Surgically absent Right adnexa--non tender, no mass Left adnexa--non tender, no mass Skin:    General: Skin is warm and dry.  Neurological:     Mental Status: She is alert and oriented to person, place, and time.  Psychiatric:        Mood and Affect: Mood normal.    Vitals:   01/01/24 1021  BP: 132/87  Pulse: 92  Weight: 150 lb (68 kg)  Height: 4' 11 (1.499 m)           Assessment & Plan:   73 yo female with moderate bladder prolapse and atrophic vaginitis.    Resume estrace  to introitus to help with burning.  Monitor urine stream and frequency.  Pt is not symptomatic form the bladder prolapse and does not desire surgical intervention.  Will see her in 1 year to reassess.  If she does desire treatment will send to  urogyn.
